# Patient Record
Sex: Female | Born: 1965 | Race: White | Hispanic: No | Marital: Single | Smoking: Never smoker
Health system: Southern US, Community
[De-identification: ages and names within clinical notes are randomized; demographics above are authoritative.]

## PROBLEM LIST (undated history)

## (undated) DIAGNOSIS — T4145XA Adverse effect of unspecified anesthetic, initial encounter: Secondary | ICD-10-CM

## (undated) DIAGNOSIS — R011 Cardiac murmur, unspecified: Secondary | ICD-10-CM

## (undated) DIAGNOSIS — I341 Nonrheumatic mitral (valve) prolapse: Secondary | ICD-10-CM

## (undated) DIAGNOSIS — Z87442 Personal history of urinary calculi: Secondary | ICD-10-CM

## (undated) DIAGNOSIS — R51 Headache: Secondary | ICD-10-CM

## (undated) DIAGNOSIS — T8859XA Other complications of anesthesia, initial encounter: Secondary | ICD-10-CM

## (undated) DIAGNOSIS — R519 Headache, unspecified: Secondary | ICD-10-CM

## (undated) HISTORY — PX: TUBAL LIGATION: SHX77

## (undated) HISTORY — PX: LITHOTRIPSY: SUR834

## (undated) SURGERY — BREAST BIOPSY WITH NEEDLE LOCALIZATION
Anesthesia: General | Laterality: Right

---

## 2004-09-18 ENCOUNTER — Emergency Department: Payer: Self-pay | Admitting: General Practice

## 2011-05-10 ENCOUNTER — Ambulatory Visit: Payer: Self-pay

## 2011-05-13 ENCOUNTER — Ambulatory Visit: Payer: Self-pay

## 2012-12-28 ENCOUNTER — Ambulatory Visit: Payer: Self-pay | Admitting: Family Medicine

## 2014-11-07 ENCOUNTER — Encounter: Payer: Self-pay | Admitting: Family Medicine

## 2014-11-07 ENCOUNTER — Ambulatory Visit (INDEPENDENT_AMBULATORY_CARE_PROVIDER_SITE_OTHER): Payer: Managed Care, Other (non HMO) | Admitting: Family Medicine

## 2014-11-07 VITALS — BP 104/66 | HR 88 | Ht 61.5 in | Wt 129.8 lb

## 2014-11-07 DIAGNOSIS — R0789 Other chest pain: Secondary | ICD-10-CM | POA: Diagnosis not present

## 2014-11-07 DIAGNOSIS — R5383 Other fatigue: Secondary | ICD-10-CM | POA: Diagnosis not present

## 2014-11-07 DIAGNOSIS — Z780 Asymptomatic menopausal state: Secondary | ICD-10-CM | POA: Diagnosis not present

## 2014-11-07 DIAGNOSIS — F5109 Other insomnia not due to a substance or known physiological condition: Secondary | ICD-10-CM | POA: Insufficient documentation

## 2014-11-07 DIAGNOSIS — K5900 Constipation, unspecified: Secondary | ICD-10-CM | POA: Insufficient documentation

## 2014-11-07 DIAGNOSIS — Z23 Encounter for immunization: Secondary | ICD-10-CM | POA: Diagnosis not present

## 2014-11-07 DIAGNOSIS — Z808 Family history of malignant neoplasm of other organs or systems: Secondary | ICD-10-CM | POA: Diagnosis not present

## 2014-11-07 NOTE — Progress Notes (Signed)
Date:  11/07/2014   Name:  Toni SanfilippoStephanie R Shea   DOB:  10-15-1965   MRN:  [id number]  PCP:  Schuyler AmorWilliam Kiri Hinderliter, MD    Chief Complaint: Establish Care; Fatigue; and Weight Gain   History of Present Illness:  This is a 49 y.o. female with persistent fatigue, terminal insomnia. Premenopausal sxs for 4 years, mother also went through menopause in early 2040's. Frequent hot flashes, black cohosh no help. Also c/o decreased concentration and irritability but denies depression. Has nonexertional chest pains with tightness L upper arm, no associated cardiac sxs, hx heart murmur. Chronic constipation, is gaining weight, can't exercise due to fatigue. Tetanus status unknown, declines flu imm. Mother with hx melanoma. Mammogram 2015, just received note to schedule one this year, no colonoscopy.  Review of Systems:  Review of Systems  Constitutional: Negative for fever.  HENT: Negative for ear pain, sore throat and trouble swallowing.   Eyes: Negative for pain.  Respiratory: Negative for cough and shortness of breath.   Cardiovascular: Negative for leg swelling.  Gastrointestinal: Negative for nausea, abdominal pain and diarrhea.  Endocrine: Negative for polyuria.  Genitourinary: Negative for difficulty urinating.  Neurological: Negative for syncope and light-headedness.  Hematological: Negative for adenopathy.  Psychiatric/Behavioral: Negative for suicidal ideas, hallucinations, behavioral problems, confusion, self-injury and agitation. The patient is not hyperactive.     Patient Active Problem List   Diagnosis Date Noted  . Menopause 11/07/2014  . Other fatigue 11/07/2014  . Non-cardiac chest pain 11/07/2014  . Terminal insomnia 11/07/2014  . Constipation 11/07/2014  . FH: melanoma 11/07/2014    Prior to Admission medications   Not on File    No Known Allergies  Past Surgical History  Procedure Laterality Date  . Cesarean section    . Tubal ligation    . Lithotripsy      Social  History  Substance Use Topics  . Smoking status: Never Smoker   . Smokeless tobacco: Not on file  . Alcohol Use: 0.0 oz/week    0 Standard drinks or equivalent per week    Family History  Problem Relation Age of Onset  . Cancer Mother     Melanoma    Medication list has been reviewed and updated.  Physical Examination: BP 104/66 mmHg  Pulse 88  Ht 5' 1.5" (1.562 m)  Wt 129 lb 12.8 oz (58.877 kg)  BMI 24.13 kg/m2  Physical Exam  Constitutional: She is oriented to person, place, and time. She appears well-developed and well-nourished.  HENT:  Head: Normocephalic and atraumatic.  Right Ear: External ear normal.  Left Ear: External ear normal.  Nose: Nose normal.  Mouth/Throat: Oropharynx is clear and moist.  Eyes: Conjunctivae and EOM are normal. Pupils are equal, round, and reactive to light.  Neck: Neck supple. No thyromegaly present.  Cardiovascular: Normal rate, regular rhythm and normal heart sounds.   Pulmonary/Chest: Effort normal and breath sounds normal.  Abdominal: Soft. She exhibits no distension and no mass. There is no tenderness.  Musculoskeletal: She exhibits no edema.  Lymphadenopathy:    She has no cervical adenopathy.  Neurological: She is alert and oriented to person, place, and time. Coordination normal.  Skin: Skin is warm and dry.  Psychiatric: She has a normal mood and affect. Her behavior is normal. Judgment and thought content normal.  Nursing note and vitals reviewed.   Assessment and Plan:  1. Menopause With frequent hot flashes, black cohosh not helpful - Lipid Profile - Vitamin D (25 hydroxy)  2.  Other fatigue Suspect menopausal depression though pt denies dysphoria, check labs, consider Paxil (may also help with hot flashes) - COMPLETE METABOLIC PANEL WITH GFR - CBC - TSH - B12  3. Non-cardiac chest pain Reassured, may be anxiety related, monitor  4. Terminal insomnia Suspect depression related  5. Constipation, unspecified  constipation type Check TSH  6. FH: melanoma  Return in about 4 weeks (around 12/05/2014).  Dionne Ano. Kingsley Spittle MD [facility]  11/07/2014

## 2014-11-08 LAB — CBC
HEMATOCRIT: 40.8 % (ref 34.0–46.6)
Hemoglobin: 13.6 g/dL (ref 11.1–15.9)
MCH: 31.1 pg (ref 26.6–33.0)
MCHC: 33.3 g/dL (ref 31.5–35.7)
MCV: 93 fL (ref 79–97)
PLATELETS: 242 10*3/uL (ref 150–379)
RBC: 4.38 x10E6/uL (ref 3.77–5.28)
RDW: 13.1 % (ref 12.3–15.4)
WBC: 4.5 10*3/uL (ref 3.4–10.8)

## 2014-11-08 LAB — LIPID PANEL
CHOL/HDL RATIO: 2.6 ratio (ref 0.0–4.4)
Cholesterol, Total: 144 mg/dL (ref 100–199)
HDL: 56 mg/dL (ref >39)
LDL CALC: 75 mg/dL (ref 0–99)
Triglycerides: 66 mg/dL (ref 0–149)
VLDL Cholesterol Cal: 13 mg/dL (ref 5–40)

## 2014-11-08 LAB — VITAMIN B12: Vitamin B-12: 397 pg/mL (ref 211–946)

## 2014-11-08 LAB — TSH: TSH: 0.951 u[IU]/mL (ref 0.450–4.500)

## 2014-11-08 LAB — VITAMIN D 25 HYDROXY (VIT D DEFICIENCY, FRACTURES): VIT D 25 HYDROXY: 43.2 ng/mL (ref 30.0–100.0)

## 2014-11-10 ENCOUNTER — Other Ambulatory Visit: Payer: Self-pay

## 2014-11-10 MED ORDER — PAROXETINE HCL 20 MG PO TABS: 20.0000 mg | ORAL_TABLET | Freq: Every day | ORAL | Status: DC

## 2014-11-10 NOTE — Addendum Note (Signed)
Addended by: Schuyler AmorPLONK, Tong Pieczynski on: 11/10/2014 08:47 AM   Modules accepted: Orders

## 2014-11-18 ENCOUNTER — Telehealth: Payer: Self-pay

## 2014-11-18 ENCOUNTER — Other Ambulatory Visit: Payer: Self-pay | Admitting: Family Medicine

## 2014-11-18 MED ORDER — CITALOPRAM HYDROBROMIDE 20 MG PO TABS: 20.0000 mg | ORAL_TABLET | Freq: Every day | ORAL | Status: DC

## 2014-11-18 NOTE — Telephone Encounter (Signed)
Recommend change to Celexa (Zoloft less effective in menopause), will send rx to pharmacy

## 2014-11-18 NOTE — Telephone Encounter (Signed)
Sent to [facility] 

## 2014-12-05 ENCOUNTER — Ambulatory Visit (INDEPENDENT_AMBULATORY_CARE_PROVIDER_SITE_OTHER): Payer: Managed Care, Other (non HMO) | Admitting: Family Medicine

## 2014-12-05 ENCOUNTER — Encounter: Payer: Self-pay | Admitting: Family Medicine

## 2014-12-05 VITALS — BP 120/64 | HR 80 | Ht 61.5 in | Wt 133.0 lb

## 2014-12-05 DIAGNOSIS — F3289 Other specified depressive episodes: Secondary | ICD-10-CM

## 2014-12-05 MED ORDER — CITALOPRAM HYDROBROMIDE 20 MG PO TABS: 20.0000 mg | ORAL_TABLET | Freq: Every day | ORAL | Status: DC

## 2014-12-05 NOTE — Progress Notes (Signed)
Date:  12/05/2014   Name:  Tery SanfilippoStephanie R Hallowell   DOB:  06/26/1965   MRN:  [id number]  PCP:  Schuyler AmorWilliam Blaize Epple, MD    Chief Complaint: Depression   History of Present Illness:  This is a 49 y.o. female started on Celexa one month ago for menopausal depression (did not tolerate Paxil due to palpitations). Reports insomnia resolved and fatigue and hot flashes improved. Constipation the same. No se's Celexa noted.  Review of Systems:  Review of Systems  Constitutional: Negative for unexpected weight change.  Respiratory: Negative for shortness of breath.   Cardiovascular: Negative for chest pain and palpitations.  Neurological: Negative for syncope and light-headedness.  Psychiatric/Behavioral: The patient is not nervous/anxious.     Patient Active Problem List   Diagnosis Date Noted  . 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 Menopause 11/07/2014  . Other fatigue 11/07/2014  . Non-cardiac chest pain 11/07/2014  . Constipation 11/07/2014  . FH: melanoma 11/07/2014    Prior to Admission medications   Medication Sig Start Date End Date Taking? Authorizing Provider  Biotin 10 MG TABS Take 1 tablet by mouth daily.   Yes Historical Provider, MD  citalopram (CELEXA) 20 MG tablet Take 1 tablet (20 mg total) by mouth daily. 12/05/14  Yes Schuyler AmorWilliam Hollyann Pablo, MD    No Known Allergies  Past Surgical History  Procedure Laterality Date  . Cesarean section    . Tubal ligation    . Lithotripsy      Social History  Substance Use Topics  . Smoking status: Never Smoker   . Smokeless tobacco: None  . Alcohol Use: 0.0 oz/week    0 Standard drinks or equivalent per week    Family History  Problem Relation Age of Onset  . Cancer Mother     Melanoma    Medication list has been reviewed and updated.  Physical Examination: BP 120/64 mmHg  Pulse 80  Ht 5' 1.5" (1.562 m)  Wt 133 lb (60.328 kg)  BMI 24.73 kg/m2  Physical Exam  Constitutional: She is oriented to person, place, and time. She  appears well-developed and well-nourished.  Neurological: She is alert and oriented to person, place, and time. Coordination normal.  Skin: Skin is warm and dry.  Psychiatric: She has a normal mood and affect. Her behavior is normal.  Nursing note and vitals reviewed.   Assessment and Plan:  1. Menopausal depression Improved on Celexa x 1 month, refill - citalopram (CELEXA) 20 MG tablet; Take 1 tablet (20 mg total) by mouth daily.  Dispense: 90 tablet; Refill: 3  Return in about 3 months (around 03/07/2015).  Dionne AnoWilliam M. Kingsley SpittlePlonk, Jr. MD [facility]  12/05/2014

## 2015-01-23 ENCOUNTER — Telehealth: Payer: Self-pay

## 2015-01-23 DIAGNOSIS — F3289 Other specified depressive episodes: Secondary | ICD-10-CM

## 2015-01-23 MED ORDER — CITALOPRAM HYDROBROMIDE 40 MG PO TABS: 40.0000 mg | ORAL_TABLET | Freq: Every day | ORAL | Status: DC

## 2015-01-23 NOTE — Addendum Note (Signed)
Addended by: Schuyler AmorPLONK, Brennin Durfee on: 01/23/2015 10:48 AM   Modules accepted: Orders

## 2015-01-23 NOTE — Telephone Encounter (Signed)
Sent to [facility] 

## 2015-03-06 ENCOUNTER — Ambulatory Visit: Payer: Self-pay | Admitting: Family Medicine

## 2015-03-12 ENCOUNTER — Ambulatory Visit (INDEPENDENT_AMBULATORY_CARE_PROVIDER_SITE_OTHER): Payer: Managed Care, Other (non HMO) | Admitting: Family Medicine

## 2015-03-12 ENCOUNTER — Encounter: Payer: Self-pay | Admitting: Family Medicine

## 2015-03-12 VITALS — BP 108/59 | HR 72 | Ht 61.5 in | Wt 134.6 lb

## 2015-03-12 DIAGNOSIS — F3289 Other specified depressive episodes: Secondary | ICD-10-CM | POA: Diagnosis not present

## 2015-03-12 NOTE — Progress Notes (Signed)
Date:  03/12/2015   Name:  Toni Shea   DOB:  02/02/1965   MRN:  [id number]  PCP:  Schuyler Amor, MD    Chief Complaint: Follow-up and Menopausal Depression   History of Present Illness:  This is a 50 y.o. female for f/u postmenopausal depression, Celexa dose increased last month to 40 mg daily, tolerating well, has helped significantly but getting tired mid-morning, taking Celexa one hour before bedtime.  Review of Systems:  Review of Systems  Respiratory: Negative for shortness of breath.   Cardiovascular: Negative for chest pain and leg swelling.  Neurological: Negative for syncope and light-headedness.    Patient Active Problem List   Diagnosis Date Noted  . 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 Menopause 11/07/2014  . Other fatigue 11/07/2014  . Non-cardiac chest pain 11/07/2014  . Constipation 11/07/2014  . FH: melanoma 11/07/2014    Prior to Admission medications   Medication Sig Start Date End Date Taking? Authorizing Provider  Biotin 10 MG TABS Take 1 tablet by mouth daily.   Yes Historical Provider, MD  citalopram (CELEXA) 40 MG tablet Take 1 tablet (40 mg total) by mouth daily. 01/23/15  Yes Schuyler Amor, MD    Allergies  Allergen Reactions  . Paxil [Paroxetine Hcl] Palpitations    Past Surgical History  Procedure Laterality Date  . Cesarean section    . Tubal ligation    . Lithotripsy      Social History  Substance Use Topics  . Smoking status: Never Smoker   . Smokeless tobacco: None  . Alcohol Use: 0.0 oz/week    0 Standard drinks or equivalent per week    Family History  Problem Relation Age of Onset  . Cancer Mother     Melanoma    Medication list has been reviewed and updated.  Physical Examination: BP 108/59 mmHg  Pulse 72  Ht 5' 1.5" (1.562 m)  Wt 134 lb 9.6 oz (61.054 kg)  BMI 25.02 kg/m2  Physical Exam  Constitutional: She appears well-developed and well-nourished.  Cardiovascular: Normal rate, regular rhythm and normal  heart sounds.   Pulmonary/Chest: Effort normal and breath sounds normal.  Musculoskeletal: She exhibits no edema.  Neurological: She is alert.  Skin: Skin is warm and dry.  Psychiatric: She has a normal mood and affect. Her behavior is normal.  Nursing note and vitals reviewed.   Assessment and Plan:  1. Menopausal depression Improved on increased dose Celexa, try taking in am to help with morning fatigue, consider Effexor if sxs persist  Return in about 6 months (around 09/09/2015).  Dionne Ano. Kingsley Spittle MD [facility]  03/12/2015

## 2015-03-26 ENCOUNTER — Telehealth: Payer: Self-pay

## 2015-03-26 MED ORDER — VENLAFAXINE HCL ER 75 MG PO CP24: 75.0000 mg | ORAL_CAPSULE | Freq: Every day | ORAL | Status: DC

## 2015-03-26 NOTE — Addendum Note (Signed)
Addended by: Schuyler AmorPLONK, Sigurd Pugh on: 03/26/2015 10:03 AM   Modules accepted: Orders, Medications

## 2015-03-26 NOTE — Telephone Encounter (Signed)
Rx for Effexor XR sent, inform pt to stop Celexa.

## 2015-03-26 NOTE — Telephone Encounter (Signed)
Sent to [facility] 

## 2015-06-24 ENCOUNTER — Other Ambulatory Visit: Payer: Self-pay | Admitting: Family Medicine

## 2015-06-25 ENCOUNTER — Telehealth: Payer: Self-pay

## 2015-06-25 ENCOUNTER — Other Ambulatory Visit: Payer: Self-pay | Admitting: Family Medicine

## 2015-06-25 MED ORDER — VENLAFAXINE HCL ER 75 MG PO CP24: 75.0000 mg | ORAL_CAPSULE | Freq: Every day | ORAL | Status: DC

## 2015-06-25 NOTE — Telephone Encounter (Signed)
Effexor refill sent.  

## 2015-06-25 NOTE — Telephone Encounter (Signed)
Needs menopause med refill

## 2015-09-04 ENCOUNTER — Ambulatory Visit (INDEPENDENT_AMBULATORY_CARE_PROVIDER_SITE_OTHER): Payer: Managed Care, Other (non HMO) | Admitting: Internal Medicine

## 2015-09-04 ENCOUNTER — Encounter: Payer: Self-pay | Admitting: Internal Medicine

## 2015-09-04 VITALS — BP 110/78 | HR 94 | Resp 16 | Ht 62.0 in | Wt 134.0 lb

## 2015-09-04 DIAGNOSIS — Z1239 Encounter for other screening for malignant neoplasm of breast: Secondary | ICD-10-CM

## 2015-09-04 DIAGNOSIS — F3289 Other specified depressive episodes: Secondary | ICD-10-CM | POA: Diagnosis not present

## 2015-09-04 NOTE — Progress Notes (Signed)
    Date:  09/04/2015   Name:  Toni SanfilippoStephanie R Nappier   DOB:  04-Jun-1965   MRN:  [id number]   Chief Complaint: Menopause (Still some hot flashes and some weight gain.) Menopausal sx - much better with effexor.  Night sweats have resolved.  Has minimal hot flashes during the day.  Her last full menstrual cycle was about 1 year ago. He months ago she had a couple of days of very light spotting. She has a gynecologist [facility] but he recently retired. She is due for a mammogram. Her last Pap smear was 1 year ago. She had extensive labs done in October 2016 which were all normal.     Wt Readings from Last 3 Encounters:  09/04/15 134 lb (60.8 kg)  03/12/15 134 lb 9.6 oz (61.1 kg)  12/05/14 133 lb (60.3 kg)    Review of Systems  Constitutional: Negative for chills, fatigue, fever and unexpected weight change.  Respiratory: Negative for choking and shortness of breath.   Cardiovascular: Negative for chest pain, palpitations and leg swelling.  Gastrointestinal: Positive for constipation. Negative for abdominal pain and diarrhea.  Genitourinary: Negative for dysuria, menstrual problem, pelvic pain and vaginal discharge.  Musculoskeletal: Negative for arthralgias.  Neurological: Negative for dizziness and headaches.  Hematological: Negative for adenopathy. Does not bruise/bleed easily.    Patient Active Problem List   Diagnosis Date Noted  . 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 Menopause 11/07/2014  . Constipation 11/07/2014  . FH: melanoma 11/07/2014    Prior to Admission medications   Medication Sig Start Date End Date Taking? Authorizing Provider  Biotin 10 MG TABS Take 1 tablet by mouth daily.   Yes Historical Provider, MD  venlafaxine XR (EFFEXOR XR) 75 MG 24 hr capsule Take 1 capsule (75 mg total) by mouth daily with breakfast. 06/25/15  Yes Schuyler AmorWilliam [facility], MD    Allergies  Allergen Reactions  . Paxil [Paroxetine Hcl] Palpitations    Past Surgical History:  Procedure Laterality  Date  . CESAREAN SECTION    . LITHOTRIPSY    . TUBAL LIGATION      Social History  Substance Use Topics  . Smoking status: Never Smoker  . Smokeless tobacco: Not on file  . Alcohol use 0.0 oz/week     Medication list has been reviewed and updated.   Physical Exam  Constitutional: She is oriented to person, place, and time. She appears well-developed. No distress.  HENT:  Head: Normocephalic and atraumatic.  Neck: Normal range of motion. Carotid bruit is not present.  Cardiovascular: Normal rate, regular rhythm and normal heart sounds.   Pulmonary/Chest: Effort normal and breath sounds normal. No respiratory distress.  Musculoskeletal: She exhibits no edema.  Neurological: She is alert and oriented to person, place, and time.  Skin: Skin is warm and dry. No rash noted.  Psychiatric: She has a normal mood and affect. Her speech is normal and behavior is normal. Thought content normal.  Nursing note and vitals reviewed.   BP 110/78 (BP Location: Right Arm, Patient Position: Sitting, Cuff Size: Normal)   Pulse 94   Resp 16   Ht 5\' 2"  (1.575 m)   Wt 134 lb (60.8 kg)   SpO2 100%   BMI 24.51 kg/m   Assessment and Plan: 1. Menopausal depression Doing well on Effexor  2. Breast cancer screening - MM DIGITAL SCREENING BILATERAL; Future   Bari EdwardLaura Lexany Belknap, MD [facility]  09/04/2015

## 2015-09-04 NOTE — Patient Instructions (Signed)
Breast Self-Awareness Practicing breast self-awareness may pick up problems early, prevent significant medical complications, and possibly save your life. By practicing breast self-awareness, you can become familiar with how your breasts look and feel and if your breasts are changing. This allows you to notice changes early. It can also offer you some reassurance that your breast health is good. One way to learn what is normal for your breasts and whether your breasts are changing is to do a breast self-exam. If you find a lump or something that was not present in the past, it is best to contact your caregiver right away. Other findings that should be evaluated by your caregiver include nipple discharge, especially if it is bloody; skin changes or reddening; areas where the skin seems to be pulled in (retracted); or new lumps and bumps. Breast pain is seldom associated with cancer (malignancy), but should also be evaluated by a caregiver. HOW TO PERFORM A BREAST SELF-EXAM The best time to examine your breasts is 5-7 days after your menstrual period is over. During menstruation, the breasts are lumpier, and it may be more difficult to pick up changes. If you do not menstruate, have reached menopause, or had your uterus removed (hysterectomy), you should examine your breasts at regular intervals, such as monthly. If you are breastfeeding, examine your breasts after a feeding or after using a breast pump. Breast implants do not decrease the risk for lumps or tumors, so continue to perform breast self-exams as recommended. Talk to your caregiver about how to determine the difference between the implant and breast tissue. Also, talk about the amount of pressure you should use during the exam. Over time, you will become more familiar with the variations of your breasts and more comfortable with the exam. A breast self-exam requires you to remove all your clothes above the waist. 1. Look at your breasts and nipples.  Stand in front of a mirror in a room with good lighting. With your hands on your hips, push your hands firmly downward. Look for a difference in shape, contour, and size from one breast to the other (asymmetry). Asymmetry includes puckers, dips, or bumps. Also, look for skin changes, such as reddened or scaly areas on the breasts. Look for nipple changes, such as discharge, dimpling, repositioning, or redness. 2. Carefully feel your breasts. This is best done either in the shower or tub while using soapy water or when flat on your back. Place the arm (on the side of the breast you are examining) above your head. Use the pads (not the fingertips) of your three middle fingers on your opposite hand to feel your breasts. Start in the underarm area and use  inch (2 cm) overlapping circles to feel your breast. Use 3 different levels of pressure (light, medium, and firm pressure) at each circle before moving to the next circle. The light pressure is needed to feel the tissue closest to the skin. The medium pressure will help to feel breast tissue a little deeper, while the firm pressure is needed to feel the tissue close to the ribs. Continue the overlapping circles, moving downward over the breast until you feel your ribs below your breast. Then, move one finger-width towards the center of the body. Continue to use the  inch (2 cm) overlapping circles to feel your breast as you move slowly up toward the collar bone (clavicle) near the base of the neck. Continue the up and down exam using all 3 pressures until you reach the   middle of the chest. Do this with each breast, carefully feeling for lumps or changes. 3.  Keep a written record with breast changes or normal findings for each breast. By writing this information down, you do not need to depend only on memory for size, tenderness, or location. Write down where you are in your menstrual cycle, if you are still menstruating. Breast tissue can have some lumps or  thick tissue. However, see your caregiver if you find anything that concerns you.  SEEK MEDICAL CARE IF:  You see a change in shape, contour, or size of your breasts or nipples.   You see skin changes, such as reddened or scaly areas on the breasts or nipples.   You have an unusual discharge from your nipples.   You feel a new lump or unusually thick areas.    This information is not intended to replace advice given to you by your health care provider. Make sure you discuss any questions you have with your health care provider.   Document Released: 01/03/2005 Document Revised: 12/21/2011 Document Reviewed: 04/20/2011 Elsevier Interactive Patient Education 2016 Elsevier Inc.  

## 2015-09-11 ENCOUNTER — Ambulatory Visit: Payer: Self-pay | Admitting: Family Medicine

## 2015-10-02 ENCOUNTER — Ambulatory Visit
Admission: RE | Admit: 2015-10-02 | Discharge: 2015-10-02 | Disposition: A | Discharge status: Home / Self Care | Payer: Managed Care, Other (non HMO) | Source: Ambulatory Visit | Attending: Internal Medicine | Admitting: Internal Medicine

## 2015-10-02 DIAGNOSIS — Z1231 Encounter for screening mammogram for malignant neoplasm of breast: Secondary | ICD-10-CM | POA: Diagnosis not present

## 2015-10-02 DIAGNOSIS — Z1239 Encounter for other screening for malignant neoplasm of breast: Secondary | ICD-10-CM | POA: Diagnosis not present

## 2015-10-05 ENCOUNTER — Other Ambulatory Visit: Payer: Self-pay | Admitting: Internal Medicine

## 2015-10-05 DIAGNOSIS — R921 Mammographic calcification found on diagnostic imaging of breast: Secondary | ICD-10-CM

## 2015-10-23 ENCOUNTER — Ambulatory Visit
Admission: RE | Admit: 2015-10-23 | Discharge: 2015-10-23 | Disposition: A | Discharge status: Home / Self Care | Payer: Managed Care, Other (non HMO) | Source: Ambulatory Visit | Attending: Internal Medicine | Admitting: Internal Medicine

## 2015-10-23 DIAGNOSIS — R921 Mammographic calcification found on diagnostic imaging of breast: Secondary | ICD-10-CM | POA: Insufficient documentation

## 2015-10-26 ENCOUNTER — Other Ambulatory Visit: Payer: Self-pay | Admitting: Internal Medicine

## 2015-10-26 DIAGNOSIS — R921 Mammographic calcification found on diagnostic imaging of breast: Secondary | ICD-10-CM

## 2015-12-03 ENCOUNTER — Ambulatory Visit
Admission: RE | Admit: 2015-12-03 | Discharge: 2015-12-03 | Disposition: A | Discharge status: Home / Self Care | Payer: Managed Care, Other (non HMO) | Source: Ambulatory Visit | Attending: Internal Medicine | Admitting: Internal Medicine

## 2015-12-03 DIAGNOSIS — N6091 Unspecified benign mammary dysplasia of right breast: Secondary | ICD-10-CM | POA: Insufficient documentation

## 2015-12-03 DIAGNOSIS — R921 Mammographic calcification found on diagnostic imaging of breast: Secondary | ICD-10-CM

## 2015-12-03 HISTORY — PX: BREAST BIOPSY: SHX20

## 2015-12-04 LAB — SURGICAL PATHOLOGY

## 2015-12-16 ENCOUNTER — Other Ambulatory Visit: Payer: Self-pay | Admitting: Internal Medicine

## 2015-12-16 DIAGNOSIS — N6099 Unspecified benign mammary dysplasia of unspecified breast: Secondary | ICD-10-CM | POA: Insufficient documentation

## 2015-12-16 DIAGNOSIS — R928 Other abnormal and inconclusive findings on diagnostic imaging of breast: Secondary | ICD-10-CM

## 2015-12-18 ENCOUNTER — Encounter: Payer: Self-pay | Admitting: Surgery

## 2015-12-18 ENCOUNTER — Ambulatory Visit (INDEPENDENT_AMBULATORY_CARE_PROVIDER_SITE_OTHER): Payer: Managed Care, Other (non HMO) | Admitting: Surgery

## 2015-12-18 VITALS — BP 137/71 | HR 112 | Temp 98.5°F | Ht 62.0 in | Wt 142.0 lb

## 2015-12-18 DIAGNOSIS — R928 Other abnormal and inconclusive findings on diagnostic imaging of breast: Secondary | ICD-10-CM | POA: Diagnosis not present

## 2015-12-18 NOTE — Patient Instructions (Signed)
We have spoken today about removing a lump in your breast. We will have this done on (to be determined) [facility]. We will call you with the details of this surgery.  You will most likely be able to leave the hospital several hours after your surgery.  Plan to tenatively be off work for 1-2 weeks following the surgery and may return with approximately 4 more weeks of a lifting restriction, no greater than 15 lbs.    Lumpectomy A lumpectomy is a form of "breast conserving" or "breast preservation" surgery. It may also be referred to as a partial mastectomy. During a lumpectomy, the portion of the breast that contains the cancerous tumor or breast mass (the lump) is removed. Some normal tissue around the lump may also be removed to make sure all of the tumor has been removed.  LET [facility]OUR [facility]ARE PROVIDER KNOW ABOUT:  Any allergies you have.  All medicines you are taking, including vitamins, herbs, eye drops, creams, and over-the-counter medicines.  Previous problems you or members of your family have had with the use of anesthetics.  Any blood disorders you have.  Previous surgeries you have had.  Medical conditions you have. RISKS AND COMPLICATIONS Generally, this is a safe procedure. However, problems can occur and include:  Bleeding.  Infection.  Pain.  Temporary swelling.  Change in the shape of the breast, particularly if a large portion is removed. BEFORE THE PROCEDURE  Ask your health care provider about changing or stopping your regular medicines. This is especially important if you are taking diabetes medicines or blood thinners.  Do not eat or drink anything after midnight on the night before the procedure or as directed by your health care provider. Ask your health care provider if you can take a sip of water with any approved medicines.  On the day of surgery, your health care provider will use a mammogram or ultrasound to locate and mark the tumor in  your breast. These markings on your breast will show where the cut (incision) will be made. PROCEDURE   An IV tube will be put into one of your veins.  You may be given medicine to help you relax before the surgery (sedative). You will be given one of the following:  A medicine that numbs the area (local anesthetic).  A medicine that makes you fall asleep (general anesthetic).  Your health care provider will use a kind of electric scalpel that uses heat to minimize bleeding (electrocautery knife).  A curved incision (like a smile or frown) that follows the natural curve of your breast is made, to allow for minimal scarring and better healing.  The tumor will be removed with some of the surrounding tissue. This will be sent to the lab for analysis. Your health care provider may also remove your lymph nodes at this time if needed.  Sometimes, but not always, a rubber tube called a drain will be surgically inserted into your breast area or armpit to collect excess fluid that may accumulate in the space where the tumor was. This drain is connected to a plastic bulb on the outside of your body. This drain creates suction to help remove the fluid.  The incisions will be closed with stitches (sutures).  A bandage may be placed over the incisions. AFTER THE PROCEDURE  You will be taken to the recovery area.  You will be given medicine for pain.  A small rubber drain may be placed in the  breast for 2-3 days to prevent a collection of blood (hematoma) from developing in the breast. You will be given instructions on caring for the drain before you go home.  A pressure bandage (dressing) will be applied for 1-2 days to prevent bleeding. Ask your health care provider how to care for your bandage at home.   This information is not intended to replace advice given to you by your health care provider. Make sure you discuss any questions you have with your health care provider.   Document Released:  02/14/2006 Document Revised: 01/24/2014 Document Reviewed: 06/08/2012 Elsevier Interactive Patient Education [employer].

## 2015-12-18 NOTE — Progress Notes (Signed)
Surgical Consultation  12/18/2015  Toni SanfilippoStephanie R Shea is an 50 y.o. female.   CC: Right breast ADH on core  HPI: This patient is at a right breast core biopsy showing ADH with minimal atypia. This was found on routine screening mammogram that the necessitated further workup and ultimately core biopsy on the right. She does regular self exams has never noticed a mass before she did have a cyst in her left breast identified at one point several years ago did not require surgery. She's had no other breast surgery denies nipple discharge.  The only medication she takes is effects or has not been on hormone therapy.   GYN History: G2 P2 AB 0  Family Breast Cancer History: Paternal grandmother with breast cancer  No past medical history on file.  Past Surgical History:  Procedure Laterality Date  . CESAREAN SECTION    . LITHOTRIPSY    . TUBAL LIGATION      Family History  Problem Relation Age of Onset  . Cancer Mother     Melanoma  . Breast cancer Paternal Grandmother     Social History:  reports that she has never smoked. She does not have any smokeless tobacco history on file. She reports that she drinks alcohol. She reports that she does not use drugs.  Allergies:  Allergies  Allergen Reactions  . Paxil [Paroxetine Hcl] Palpitations    Medications reviewed.   Review of Systems:   Review of Systems  Constitutional: Negative for chills and fever.  HENT: Negative.   Eyes: Negative.   Respiratory: Negative.   Cardiovascular: Negative.   Gastrointestinal: Negative.   Genitourinary: Negative.   Musculoskeletal: Negative.   Skin: Negative.   Neurological: Negative.   Endo/Heme/Allergies: Negative.   Psychiatric/Behavioral: Negative.      Physical Exam:  There were no vitals taken for this visit.  Physical Exam  Constitutional: She is oriented to person, place, and time and well-developed, well-nourished, and in no distress. No distress.  HENT:  Head:  Normocephalic and atraumatic.  Eyes: Right eye exhibits no discharge. Left eye exhibits no discharge. No scleral icterus.  Neck: Normal range of motion.  Cardiovascular: Normal rate, regular rhythm and normal heart sounds.   Pulmonary/Chest: Effort normal and breath sounds normal. No stridor. No respiratory distress. She has no wheezes.    Abdominal: Soft. She exhibits no distension.  Musculoskeletal: Normal range of motion. She exhibits no edema.  Lymphadenopathy:    She has no cervical adenopathy.  Neurological: She is alert and oriented to person, place, and time.  Skin: Skin is warm and dry. She is not diaphoretic.  Psychiatric: Mood and affect normal.  Vitals reviewed.   Breast Exam: On the right side is a biopsy site from core biopsy at the 12:00 position with moderate resolving ecchymosis no erythema and no underlying mass no axillary adenopathy  On the left there is no mass and no axillary adenopathy    No results found for this or any previous visit (from the past 48 hour(s)). No results found.  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 She requires excisional biopsy utilizing a needle localization technique. A clip was in place. Mammograms have been reviewed. Offering surgery has been discussed the options of observation of been reviewed the risk of bleeding infection positive margins the potential for cancer being present or DCIS being present was discussed. We  discussed the risks of hematoma seroma cosmetic deformity and the need for further surgery should cancer be identified she understood and agreed to proceed.  Lattie Hawichard E Ronda Rajkumar, MD, FACS

## 2015-12-25 ENCOUNTER — Other Ambulatory Visit: Payer: Self-pay

## 2015-12-25 DIAGNOSIS — D0511 Intraductal carcinoma in situ of right breast: Secondary | ICD-10-CM

## 2015-12-28 ENCOUNTER — Other Ambulatory Visit: Payer: Self-pay | Admitting: Surgery

## 2015-12-28 DIAGNOSIS — D0511 Intraductal carcinoma in situ of right breast: Secondary | ICD-10-CM

## 2015-12-29 ENCOUNTER — Other Ambulatory Visit: Payer: Self-pay | Admitting: General Surgery

## 2015-12-31 ENCOUNTER — Telehealth: Payer: Self-pay | Admitting: Surgery

## 2015-12-31 NOTE — Telephone Encounter (Signed)
Pt advised of pre op date/time and sx date. Sx: 01/12/16 with Dr Magda Kielooper--Right lumpectomy with NL.  Pre op: 01/05/16 between 9-1--Phone.   Patient has been advised to [facility] at 7:50am the day of surgery.

## 2016-01-05 ENCOUNTER — Encounter
Admission: RE | Admit: 2016-01-05 | Discharge: 2016-01-05 | Disposition: A | Discharge status: Home / Self Care | Payer: Managed Care, Other (non HMO) | Source: Ambulatory Visit | Attending: Surgery | Admitting: Surgery

## 2016-01-05 HISTORY — DX: Nonrheumatic mitral (valve) prolapse: I34.1

## 2016-01-05 HISTORY — DX: Personal history of urinary calculi: Z87.442

## 2016-01-05 HISTORY — DX: Headache, unspecified: R51.9

## 2016-01-05 HISTORY — DX: Adverse effect of unspecified anesthetic, initial encounter: T41.45XA

## 2016-01-05 HISTORY — DX: Cardiac murmur, unspecified: R01.1

## 2016-01-05 HISTORY — DX: Headache: R51

## 2016-01-05 HISTORY — DX: Other complications of anesthesia, initial encounter: T88.59XA

## 2016-01-05 NOTE — Patient Instructions (Signed)
  Your procedure is scheduled on: 01-12-16 (TUESDAY) Report to [facility] @ 7:30 AM PER PT   Remember: Instructions that are not followed completely may result in serious medical risk, up to and including death, or upon the discretion of your surgeon and anesthesiologist your surgery may need to be rescheduled.    _x___ 1. Do not eat food or drink liquids after midnight. No gum chewing or hard candies.     __x__ 2. No Alcohol for 24 hours before or after surgery.   __x__3. No Smoking for 24 prior to surgery.   ____  4. Bring all medications with you on the day of surgery if instructed.    __x__ 5. Notify your doctor if there is any change in your medical condition     (cold, fever, infections).     Do not wear jewelry, make-up, hairpins, clips or nail polish.  Do not wear lotions, powders, or perfumes. You may wear deodorant.  Do not shave 48 hours prior to surgery. Men may shave face and neck.  Do not bring valuables to the hospital.    [facility] is not responsible for any belongings or valuables.               Contacts, dentures or bridgework may not be worn into surgery.  Leave your suitcase in the car. After surgery it may be brought to your room.  For patients admitted to the hospital, discharge time is determined by your treatment team.   Patients discharged the day of surgery will not be allowed to drive home.  You will need someone to drive you home and stay with you the night of your procedure.    Please read over the following fact sheets that you were given:   [facility] Preparing for Surgery and or MRSA Information   ____ Take these medicines the morning of surgery with A SIP OF WATER:    1. NONE  2.  3.  4.  5.  6.  ____Fleets enema or Magnesium Citrate as directed.   ____ Use CHG Soap or sage wipes as directed on instruction sheet   ____ Use inhalers on the day of surgery and bring to hospital day of surgery  ____ Stop metformin 2 days prior  to surgery    ____ Take 1/2 of usual insulin dose the night before surgery and none on the morning of  surgery.   ____ Stop Aspirin, Coumadin, Pllavix ,Eliquis, Effient, or Pradaxa  x__ Stop Anti-inflammatories such as Advil, Aleve, Ibuprofen, Motrin, Naproxen,          Naprosyn, Goodies powders or aspirin products NOW-Ok to take Tylenol.   _X___ Stop supplements until after surgery-STOP FISH OIL NOW  ____ Bring C-Pap to the hospital.

## 2016-01-05 NOTE — Pre-Procedure Instructions (Signed)
DURING PHONE INTERVIEW I TOLD PT SHE NEEDED TO COME IN FOR EKG DUE TO MVP.  PT STATES SHE WORKS IN [place] AND WOULD BE UNABLE TO COME IN PRIOR TO SURGERY. I EXPLAINED THAT IF EKG IS DONE AM OF SURGERY AND IT IS ABNORMAL, THEN ANESTHESIA COULD CANCEL HER SURGERY. PT STATES UNDERSTANDS THIS.

## 2016-01-08 ENCOUNTER — Other Ambulatory Visit: Payer: Self-pay

## 2016-01-08 DIAGNOSIS — N6091 Unspecified benign mammary dysplasia of right breast: Secondary | ICD-10-CM

## 2016-01-11 MED ORDER — CEFAZOLIN SODIUM-DEXTROSE 2-4 GM/100ML-% IV SOLN: 2.0000 g | INTRAVENOUS | Status: DC

## 2016-01-12 ENCOUNTER — Encounter: Payer: Self-pay | Admitting: *Deleted

## 2016-01-12 ENCOUNTER — Ambulatory Visit: Payer: Managed Care, Other (non HMO) | Admitting: Certified Registered Nurse Anesthetist

## 2016-01-12 ENCOUNTER — Encounter
Admission: RE | Disposition: A | Discharge status: Home / Self Care | Payer: Self-pay | Source: Ambulatory Visit | Attending: Surgery

## 2016-01-12 ENCOUNTER — Ambulatory Visit
Admission: RE | Admit: 2016-01-12 | Discharge: 2016-01-12 | Disposition: A | Discharge status: Home / Self Care | Payer: Managed Care, Other (non HMO) | Source: Ambulatory Visit | Attending: Surgery | Admitting: Surgery

## 2016-01-12 DIAGNOSIS — D0511 Intraductal carcinoma in situ of right breast: Secondary | ICD-10-CM

## 2016-01-12 DIAGNOSIS — R928 Other abnormal and inconclusive findings on diagnostic imaging of breast: Secondary | ICD-10-CM | POA: Diagnosis not present

## 2016-01-12 DIAGNOSIS — N6091 Unspecified benign mammary dysplasia of right breast: Secondary | ICD-10-CM | POA: Insufficient documentation

## 2016-01-12 DIAGNOSIS — Z79899 Other long term (current) drug therapy: Secondary | ICD-10-CM | POA: Insufficient documentation

## 2016-01-12 HISTORY — PX: BREAST EXCISIONAL BIOPSY: SUR124

## 2016-01-12 HISTORY — PX: BREAST LUMPECTOMY WITH NEEDLE LOCALIZATION: SHX5759

## 2016-01-12 LAB — POCT PREGNANCY, URINE: PREG TEST UR: NEGATIVE

## 2016-01-12 SURGERY — BREAST LUMPECTOMY WITH NEEDLE LOCALIZATION
Anesthesia: General | Laterality: Right

## 2016-01-12 MED ORDER — PROPOFOL 10 MG/ML IV BOLUS
INTRAVENOUS | Status: AC
  Filled 2016-01-12: qty 20

## 2016-01-12 MED ORDER — ONDANSETRON HCL 4 MG/2ML IJ SOLN: 4.0000 mg | Freq: Once | INTRAMUSCULAR | Status: DC | PRN

## 2016-01-12 MED ORDER — DEXAMETHASONE SODIUM PHOSPHATE 10 MG/ML IJ SOLN
INTRAMUSCULAR | Status: DC | PRN
Start: 2016-01-12 — End: 2016-01-12
  Administered 2016-01-12: 10 mg via INTRAVENOUS

## 2016-01-12 MED ORDER — LIDOCAINE HCL (CARDIAC) 20 MG/ML IV SOLN
INTRAVENOUS | Status: DC | PRN
  Administered 2016-01-12: 88 mg via INTRAVENOUS

## 2016-01-12 MED ORDER — BUPIVACAINE-EPINEPHRINE (PF) 0.25% -1:200000 IJ SOLN
INTRAMUSCULAR | Status: AC
  Filled 2016-01-12: qty 30

## 2016-01-12 MED ORDER — MIDAZOLAM HCL 2 MG/2ML IJ SOLN
INTRAMUSCULAR | Status: DC | PRN
  Administered 2016-01-12: 2 mg via INTRAVENOUS

## 2016-01-12 MED ORDER — ONDANSETRON HCL 4 MG/2ML IJ SOLN
INTRAMUSCULAR | Status: DC | PRN
  Administered 2016-01-12: 4 mg via INTRAVENOUS

## 2016-01-12 MED ORDER — LACTATED RINGERS IV SOLN
INTRAVENOUS | Status: DC
  Administered 2016-01-12: 09:00:00 via INTRAVENOUS

## 2016-01-12 MED ORDER — BUPIVACAINE-EPINEPHRINE (PF) 0.25% -1:200000 IJ SOLN
INTRAMUSCULAR | Status: DC | PRN
  Administered 2016-01-12: 30 mL

## 2016-01-12 MED ORDER — FAMOTIDINE 20 MG PO TABS
20.0000 mg | ORAL_TABLET | Freq: Once | ORAL | Status: AC
  Administered 2016-01-12: 20 mg via ORAL

## 2016-01-12 MED ORDER — KETOROLAC TROMETHAMINE 30 MG/ML IJ SOLN
INTRAMUSCULAR | Status: AC
  Filled 2016-01-12: qty 1

## 2016-01-12 MED ORDER — ONDANSETRON HCL 4 MG/2ML IJ SOLN
INTRAMUSCULAR | Status: AC
  Filled 2016-01-12: qty 2

## 2016-01-12 MED ORDER — FENTANYL CITRATE (PF) 100 MCG/2ML IJ SOLN
INTRAMUSCULAR | Status: DC | PRN
  Administered 2016-01-12: 50 ug via INTRAVENOUS

## 2016-01-12 MED ORDER — KETOROLAC TROMETHAMINE 30 MG/ML IJ SOLN
INTRAMUSCULAR | Status: DC | PRN
  Administered 2016-01-12: 30 mg via INTRAVENOUS

## 2016-01-12 MED ORDER — DEXAMETHASONE SODIUM PHOSPHATE 10 MG/ML IJ SOLN
INTRAMUSCULAR | Status: AC
  Filled 2016-01-12: qty 1

## 2016-01-12 MED ORDER — MIDAZOLAM HCL 2 MG/2ML IJ SOLN
INTRAMUSCULAR | Status: AC
  Filled 2016-01-12: qty 2

## 2016-01-12 MED ORDER — LIDOCAINE 2% (20 MG/ML) 5 ML SYRINGE
INTRAMUSCULAR | Status: AC
  Filled 2016-01-12: qty 5

## 2016-01-12 MED ORDER — LACTATED RINGERS IV SOLN
INTRAVENOUS | Status: DC | PRN
  Administered 2016-01-12: 10:00:00 via INTRAVENOUS

## 2016-01-12 MED ORDER — FENTANYL CITRATE (PF) 100 MCG/2ML IJ SOLN
25.0000 ug | INTRAMUSCULAR | Status: DC | PRN
  Administered 2016-01-12 (×4): 25 ug via INTRAVENOUS

## 2016-01-12 MED ORDER — PROPOFOL 10 MG/ML IV BOLUS
INTRAVENOUS | Status: DC | PRN
  Administered 2016-01-12: 160 mg via INTRAVENOUS

## 2016-01-12 MED ORDER — CHLORHEXIDINE GLUCONATE CLOTH 2 % EX PADS: 6.0000 | MEDICATED_PAD | Freq: Once | CUTANEOUS | Status: DC

## 2016-01-12 MED ORDER — ACETAMINOPHEN 10 MG/ML IV SOLN
INTRAVENOUS | Status: DC | PRN
  Administered 2016-01-12: 1000 mg via INTRAVENOUS

## 2016-01-12 MED ORDER — FENTANYL CITRATE (PF) 100 MCG/2ML IJ SOLN
INTRAMUSCULAR | Status: AC
  Filled 2016-01-12: qty 2

## 2016-01-12 MED ORDER — HYDROCODONE-ACETAMINOPHEN 5-300 MG PO TABS: 1.0000 | ORAL_TABLET | ORAL | 0 refills | Status: DC | PRN

## 2016-01-12 MED ORDER — CHLORHEXIDINE GLUCONATE CLOTH 2 % EX PADS
6.0000 | MEDICATED_PAD | Freq: Once | CUTANEOUS | Status: AC
  Administered 2016-01-12: 6 via TOPICAL

## 2016-01-12 MED ORDER — ACETAMINOPHEN 10 MG/ML IV SOLN
INTRAVENOUS | Status: AC
  Filled 2016-01-12: qty 100

## 2016-01-12 SURGICAL SUPPLY — 28 items
ADHESIVE MASTISOL STRL (MISCELLANEOUS) ×3 IMPLANT
BLADE SURG 15 STRL LF DISP TIS (BLADE) ×1 IMPLANT
BLADE SURG 15 STRL SS (BLADE) ×2
CANISTER SUCT 1200ML W/VALVE (MISCELLANEOUS) ×3 IMPLANT
CHLORAPREP W/TINT 26ML (MISCELLANEOUS) ×3 IMPLANT
CLOSURE WOUND 1/2 X4 (GAUZE/BANDAGES/DRESSINGS) ×1
CNTNR SPEC 2.5X3XGRAD LEK (MISCELLANEOUS) ×1
CONT SPEC 4OZ STER OR WHT (MISCELLANEOUS) ×2
CONTAINER SPEC 2.5X3XGRAD LEK (MISCELLANEOUS) ×1 IMPLANT
DEVICE DUBIN SPECIMEN MAMMOGRA (MISCELLANEOUS) ×3 IMPLANT
DRAPE LAPAROTOMY TRNSV 106X77 (MISCELLANEOUS) ×3 IMPLANT
ELECT REM PT RETURN 9FT ADLT (ELECTROSURGICAL) ×3
ELECTRODE REM PT RTRN 9FT ADLT (ELECTROSURGICAL) ×1 IMPLANT
GAUZE FLUFF 18X24 1PLY STRL (GAUZE/BANDAGES/DRESSINGS) ×3 IMPLANT
GLOVE BIO SURGEON STRL SZ8 (GLOVE) ×3 IMPLANT
GOWN STRL REUS W/ TWL LRG LVL3 (GOWN DISPOSABLE) ×2 IMPLANT
GOWN STRL REUS W/TWL LRG LVL3 (GOWN DISPOSABLE) ×4
KIT RM TURNOVER STRD PROC AR (KITS) ×3 IMPLANT
LABEL OR SOLS (LABEL) ×3 IMPLANT
NDL SAFETY 22GX1.5 (NEEDLE) ×3 IMPLANT
PACK BASIN MINOR ARMC (MISCELLANEOUS) ×3 IMPLANT
STRIP CLOSURE SKIN 1/2X4 (GAUZE/BANDAGES/DRESSINGS) ×2 IMPLANT
SUT MNCRL AB 4-0 PS2 18 (SUTURE) ×3 IMPLANT
SUT VIC AB 3-0 SH 27 (SUTURE) ×2
SUT VIC AB 3-0 SH 27X BRD (SUTURE) ×1 IMPLANT
SYRINGE 10CC LL (SYRINGE) ×3 IMPLANT
TAPE MICROFOAM 4IN (TAPE) ×3 IMPLANT
WATER STERILE IRR 1000ML POUR (IV SOLUTION) ×3 IMPLANT

## 2016-01-12 NOTE — Transfer of Care (Signed)
Immediate Anesthesia Transfer of Care Note  Patient: Toni SatoStephanie R Shea  Procedure(s) Performed: Procedure(s): BREAST LUMPECTOMY WITH NEEDLE LOCALIZATION (Right)  Patient Location: PACU  Anesthesia Type:General  Level of Consciousness: responds to stimulation  Airway & Oxygen Therapy: Patient Spontanous Breathing and Patient connected to nasal cannula oxygen  Post-op Assessment: Report given to RN and Post -op Vital signs reviewed and stable  Post vital signs: Reviewed and stable  Last Vitals:  Vitals:   01/12/16 0843 01/12/16 1108  BP: (!) 112/44 (!) 114/59  Pulse: 86 (!) 110  Resp: 16 17  Temp: 36.8 C 36.4 C    Last Pain:  Vitals:   01/12/16 1108  TempSrc: Temporal  PainSc: Asleep         Complications: No apparent anesthesia complications

## 2016-01-12 NOTE — Discharge Instructions (Signed)
Remove dressing in 24 hours. °May shower in 24 hours. °Leave paper strips in place. °Resume all home medications. °Follow-up with Dr. Cooper in 10 days. ° °AMBULATORY SURGERY  °DISCHARGE INSTRUCTIONS ° ° °1) The drugs that you were given will stay in your system until tomorrow so for the next 24 hours you should not: ° °A) Drive an automobile °B) Make any legal decisions °C) Drink any alcoholic beverage ° ° °2) You may resume regular meals tomorrow.  Today it is better to start with liquids and gradually work up to solid foods. ° °You may eat anything you prefer, but it is better to start with liquids, then soup and crackers, and gradually work up to solid foods. ° ° °3) Please notify your doctor immediately if you have any unusual bleeding, trouble breathing, redness and pain at the surgery site, drainage, fever, or pain not relieved by medication. ° ° ° °4) Additional Instructions: ° ° ° ° ° ° ° °Please contact your physician with any problems or Same Day Surgery at [phone], Monday through Friday 6 am to 4 pm, or  at [facility] Main number at [phone]. °

## 2016-01-12 NOTE — Anesthesia Preprocedure Evaluation (Addendum)
Anesthesia Evaluation  Patient identified by MRN, date of birth, ID band Patient awake    Reviewed: Allergy & Precautions, NPO status , Patient's Chart, lab work & pertinent test results  History of Anesthesia Complications (+) PONV  Airway Mallampati: III       Dental   Pulmonary neg pulmonary ROS,           Cardiovascular negative cardio ROS  + Valvular Problems/Murmurs MVP      Neuro/Psych Depression    GI/Hepatic negative GI ROS, Neg liver ROS,   Endo/Other  negative endocrine ROS  Renal/GU negative Renal ROS     Musculoskeletal   Abdominal   Peds  Hematology negative hematology ROS (+)   Anesthesia Other Findings   Reproductive/Obstetrics                             Anesthesia Physical Anesthesia Plan  ASA: II  Anesthesia Plan: General   Post-op Pain Management:    Induction: Intravenous  Airway Management Planned: Nasal Cannula and LMA  Additional Equipment:   Intra-op Plan:   Post-operative Plan:   Informed Consent: I have reviewed the patients History and Physical, chart, labs and discussed the procedure including the risks, benefits and alternatives for the proposed anesthesia with the patient or authorized representative who has indicated his/her understanding and acceptance.     Plan Discussed with:   Anesthesia Plan Comments:        Anesthesia Quick Evaluation

## 2016-01-12 NOTE — Progress Notes (Signed)
Preoperative Review   Patient is met in the preoperative holding area. The history is reviewed in the chart and with the patient. I personally reviewed the options and rationale as well as the risks of this procedure that have been previously discussed with the patient. All questions asked by the patient and/or family were answered to their satisfaction.  Patient agrees to proceed with this procedure at this time.  Richard E Cooper M.D. FACS  

## 2016-01-12 NOTE — Op Note (Signed)
01/12/2016  11:12 AM  PATIENT:  Toni SatoStephanie R Shea  50 y.o. female  PRE-OPERATIVE DIAGNOSIS:  Right mammographic abnormality  POST-OPERATIVE DIAGNOSIS:  Same  PROCEDURE: Right needle localized breast biopsy  SURGEON:  Lattie Hawichard E Ondra Deboard MD, FACS   ANESTHESIA:   Gen. with LMA   Details of Procedure: This a patient with atypical ductal hyperplasia with minimal atypia on a core needle biopsy requiring full excision. We counseled her concerning the risks of bleeding infection recurrence missed lesion additional surgery and cosmetic deformity. This was all reviewed for her and her husband in the preoperative holding area the understood and agreed to proceed.  Findings: Needle localized specimen with specimen mammography demonstrating that the target marker was within the specimen and this was confirmed by specimen mammogram and conveyed to me in the operating room by radiology.  Description of procedure: Patient was induced general anesthesia prepped draped sterile fashion. A surgical pause was performed. Local anesthetic was infiltrated into the skin and subcutaneous tissues tissues around the previously placed needle localization wire. An incision was made dissection down to the end of the wire was performed the specimen was elevated and sent off for examination. Radiology called back stating that the targeted clip was within the specimen. Pathology called back stating that there was no associated mass. Maintained with electrocautery. Additional Marcaine was placed for a total of 30 cc and hemostasis was checked multiple times to ensure that there is no further bleeding. No further bleeding was identified therefore the wound was closed with deep sutures of 3-0 Vicryl followed by 40 septic or Monocryl Steri-Strips Mastisol and sterile dressings were placed  Patient tolerated the Procedure well . she was taken to recovery room in stable condition to be discharged care of her family and follow-up in  10 days.   Lattie Hawichard E Luiz Trumpower, MD FACS

## 2016-01-12 NOTE — Anesthesia Procedure Notes (Signed)
Procedure Name: LMA Insertion Date/Time: 01/12/2016 10:14 AM Performed by: Marlana SalvageJESSUP, Sieanna Vanstone Pre-anesthesia Checklist: Patient identified, Emergency Drugs available, Suction available, Patient being monitored and Timeout performed Patient Re-evaluated:Patient Re-evaluated prior to inductionOxygen Delivery Method: Circle system utilized Preoxygenation: Pre-oxygenation with 100% oxygen Intubation Type: IV induction Ventilation: Mask ventilation without difficulty LMA: LMA inserted LMA Size: 3.0 Number of attempts: 1 Placement Confirmation: positive ETCO2 and breath sounds checked- equal and bilateral Tube secured with: Tape Dental Injury: Teeth and Oropharynx as per pre-operative assessment

## 2016-01-13 LAB — SURGICAL PATHOLOGY

## 2016-01-14 ENCOUNTER — Encounter: Payer: Self-pay | Admitting: Surgery

## 2016-01-14 ENCOUNTER — Telehealth: Payer: Self-pay

## 2016-01-14 NOTE — Telephone Encounter (Signed)
Called patient to let her know that her pathology results showed that she had ATYPICAL DUCTAL HYPERPLASIA per Dr. Excell Seltzerooper. I told patient that she still needs to com in and see Dr. Excell Seltzerooper to further discuss her pathology report. Patient understood and stated that she would come to her appointment on 01/22/2016 at 10:15 AM [facility].

## 2016-01-22 ENCOUNTER — Ambulatory Visit (INDEPENDENT_AMBULATORY_CARE_PROVIDER_SITE_OTHER): Payer: Managed Care, Other (non HMO) | Admitting: Surgery

## 2016-01-22 ENCOUNTER — Encounter: Payer: Self-pay | Admitting: Surgery

## 2016-01-22 ENCOUNTER — Telehealth: Payer: Self-pay

## 2016-01-22 VITALS — BP 102/70 | HR 64 | Temp 97.8°F | Ht 61.0 in | Wt 142.6 lb

## 2016-01-22 DIAGNOSIS — R928 Other abnormal and inconclusive findings on diagnostic imaging of breast: Secondary | ICD-10-CM

## 2016-01-22 NOTE — Addendum Note (Signed)
Addended by: Cameron ProudHILDERS, WINDELLA S on: 01/22/2016 10:57 AM   Modules accepted: Orders

## 2016-01-22 NOTE — Patient Instructions (Signed)
We would like for you to have a 6 month follow up Mammogram (July 2018). I will schedule this along with a follow up to see Dr.Cooper after your mammogram and call you later today with the appointment dates and times.  Amber's contact number is [phone]. Please call our office if you have questions or concerns.

## 2016-01-22 NOTE — Progress Notes (Signed)
Outpatient postop visit  01/22/2016  Tery SanfilippoStephanie R Galano is an 51 y.o. female.    Procedure: Right Needle localized breast biopsy  CC: Minimal tenderness  HPI: This a patient status post needle localized breast biopsy on the right. She had had a core needle biopsy showing atypical ductal hyperplasia and required biopsy. Patient has no pain at this point only minimal discomfort.  Medications reviewed.    Physical Exam:  LMP 01/05/2015 (Approximate)     PE:  Minimal resolving ecchymosis no erythema no drainage nontender   Assessment/Plan:  Final pathology shows atypical ductal hyperplasia no sign of malignancy. Pathology is reviewed with the patient. She clearly has atypical ductal hyperplasia which is been completely excised. I will recommend a right mammogram as a new baseline in June and follow-up at that time.  Of significance in researching the patient's history in the computer chart, someone probably in radiology, placed the diagnosis of ductal carcinoma in situ, DCIS, in this patient's chart. To reiterate the patient has ADH and does not have DCIS and has never had any sign of DCIS. This air or was discussed with the patient. My office nurse has contacted radiology who agreed to make changes to the computer labeling of this patient to ensure that she does not carry the label of DCIS or carcinoma of any kind. This was discussed with the patient and the impact of carrying a diagnosis of DCIS in air was reviewed with her she understood this plan and we will see her back in June. She was also given a contact person to discuss this air with in our office.  Lattie Hawichard E Cooper, MD, FACS

## 2016-01-22 NOTE — Telephone Encounter (Signed)
Spoke with Judeth CornfieldStephanie at this time regarding follow up 6 month mammogram and follow up appointment with Dr.Cooper after the mammogram. Appointment reminder mailed at this time. Patient verbalized understanding.  Mammogram appointment-07-22-16 @ 2:00pm Dr.Cooper appointment- 07-25-16 8:45 am.

## 2016-01-26 NOTE — Anesthesia Postprocedure Evaluation (Signed)
Anesthesia Post Note  Patient: Toni SatoStephanie R Shea  Procedure(s) Performed: Procedure(s) (LRB): BREAST LUMPECTOMY WITH NEEDLE LOCALIZATION (Right)  Patient location during evaluation: PACU Anesthesia Type: General Level of consciousness: awake and alert Pain management: pain level controlled Vital Signs Assessment: post-procedure vital signs reviewed and stable Respiratory status: spontaneous breathing, nonlabored ventilation, respiratory function stable and patient connected to nasal cannula oxygen Cardiovascular status: blood pressure returned to baseline and stable Postop Assessment: no signs of nausea or vomiting Anesthetic complications: no     Last Vitals:  Vitals:   01/12/16 1210 01/12/16 1251  BP: (!) 102/48 (!) 104/52  Pulse: 89 88  Resp: 16 16  Temp: 36.6 C     Last Pain:  Vitals:   01/14/16 0900  TempSrc:   PainSc: 2                  Yevette EdwardsJames G Lucero Ide

## 2016-07-08 ENCOUNTER — Encounter: Payer: Self-pay | Admitting: Internal Medicine

## 2016-07-08 ENCOUNTER — Ambulatory Visit (INDEPENDENT_AMBULATORY_CARE_PROVIDER_SITE_OTHER): Payer: Managed Care, Other (non HMO) | Admitting: Internal Medicine

## 2016-07-08 VITALS — BP 104/58 | HR 94 | Ht 61.5 in | Wt 142.0 lb

## 2016-07-08 DIAGNOSIS — M25572 Pain in left ankle and joints of left foot: Secondary | ICD-10-CM | POA: Diagnosis not present

## 2016-07-08 NOTE — Progress Notes (Signed)
    Date:  07/08/2016   Name:  Toni Shea   DOB:  [birth year]   MRN:  [id number]   Chief Complaint: Joint Swelling (X 2 week ago. L) Ankle swollen. Fiance noticed and she had no pain and didn't realized until he mentioned it. No injury to ankle. Used ice and took some of it away but ice made it painful. Its on and off pain. Feels like fluid is built up on ankle. Monday leg started throbbing non stop.  )    Review of Systems  Constitutional: Negative for chills, fatigue and fever.  Respiratory: Negative for chest tightness and shortness of breath.   Cardiovascular: Negative for chest pain and palpitations.  Endocrine: Negative for polydipsia and polyuria.  Genitourinary: Negative for dysuria.  Musculoskeletal: Positive for arthralgias and joint swelling.  Skin: Negative for color change and rash.    Patient Active Problem List   Diagnosis Date Noted  . Abnormal mammogram 12/16/2015  . 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 Menopause 11/07/2014  . Constipation 11/07/2014  . FH: melanoma 11/07/2014    Prior to Admission medications   Medication Sig Start Date End Date Taking? Authorizing Provider  ibuprofen (ADVIL,MOTRIN) 200 MG tablet Take 600 mg by mouth every 8 (eight) hours as needed (for pain.).   Yes [provider]  Omega-3 Fatty Acids (FISH OIL PO) Take 250 mg by mouth daily.   Yes [provider]    Allergies  Allergen Reactions  . Other     Anesthesia--nausea/vomiting & migraine headache  . Paxil [Paroxetine Hcl] Palpitations    Past Surgical History:  Procedure Laterality Date  . BREAST LUMPECTOMY WITH NEEDLE LOCALIZATION Right 01/12/2016   Procedure: BREAST LUMPECTOMY WITH NEEDLE LOCALIZATION;  Surgeon: Lattie Hawichard E Cooper, MD;  Location: [facility] ORS;  Service: General;  Laterality: Right;  . CESAREAN SECTION    . LITHOTRIPSY    . TUBAL LIGATION      Social History  Substance Use Topics  . Smoking status: Never Smoker  . Smokeless tobacco:  Never Used  . Alcohol use 0.0 oz/week     Comment: RARE     Medication list has been reviewed and updated.   Physical Exam  Constitutional: She is oriented to person, place, and time. She appears well-developed. No distress.  HENT:  Head: Normocephalic and atraumatic.  Cardiovascular: Normal rate, regular rhythm and normal heart sounds.   Pulmonary/Chest: Effort normal and breath sounds normal. No respiratory distress. She has no wheezes.  Musculoskeletal: Normal range of motion.       Feet:  Tender achilles tendon Tender over lateral malleolus  Neurological: She is alert and oriented to person, place, and time.  Skin: Skin is warm and dry. No rash noted.  Psychiatric: She has a normal mood and affect. Her behavior is normal. Thought content normal.  Nursing note and vitals reviewed.   Ht 5' 1.5" (1.562 m)   Wt 142 lb (64.4 kg)   LMP 01/05/2015 (Approximate)   BMI 26.40 kg/m   Assessment and Plan: 1. Arthralgia of left ankle With mild edema likely due to tendonitis Recommend soft support, advil 400 mg tid Elevate when possible   No orders of the defined types were placed in this encounter.   Bari EdwardLaura Ronia Hazelett, MD [facility]  07/08/2016

## 2016-07-08 NOTE — Patient Instructions (Signed)
Advil 400 mg three times a day  Compression sleeve for ankle to wear during the day  Elevate as much as possible

## 2016-07-22 ENCOUNTER — Telehealth: Payer: Self-pay

## 2016-07-22 ENCOUNTER — Ambulatory Visit
Admission: RE | Admit: 2016-07-22 | Discharge: 2016-07-22 | Disposition: A | Discharge status: Home / Self Care | Payer: Managed Care, Other (non HMO) | Source: Ambulatory Visit | Attending: Surgery | Admitting: Surgery

## 2016-07-22 ENCOUNTER — Other Ambulatory Visit: Payer: Self-pay | Admitting: Surgery

## 2016-07-22 DIAGNOSIS — Z9889 Other specified postprocedural states: Secondary | ICD-10-CM | POA: Insufficient documentation

## 2016-07-22 DIAGNOSIS — R928 Other abnormal and inconclusive findings on diagnostic imaging of breast: Secondary | ICD-10-CM

## 2016-07-22 DIAGNOSIS — N6089 Other benign mammary dysplasias of unspecified breast: Secondary | ICD-10-CM | POA: Diagnosis present

## 2016-07-22 NOTE — Telephone Encounter (Signed)
Patient notified of Mammogram and follow up appointment on 08/19/16.

## 2016-07-25 ENCOUNTER — Ambulatory Visit: Payer: Self-pay | Admitting: Surgery

## 2016-08-10 ENCOUNTER — Ambulatory Visit
Admission: RE | Admit: 2016-08-10 | Discharge: 2016-08-10 | Disposition: A | Discharge status: Home / Self Care | Payer: Managed Care, Other (non HMO) | Source: Ambulatory Visit | Attending: Internal Medicine | Admitting: Internal Medicine

## 2016-08-10 ENCOUNTER — Ambulatory Visit (INDEPENDENT_AMBULATORY_CARE_PROVIDER_SITE_OTHER): Payer: Managed Care, Other (non HMO) | Admitting: Internal Medicine

## 2016-08-10 ENCOUNTER — Other Ambulatory Visit: Payer: Self-pay | Admitting: Internal Medicine

## 2016-08-10 ENCOUNTER — Encounter: Payer: Self-pay | Admitting: Internal Medicine

## 2016-08-10 VITALS — BP 98/62 | HR 78 | Ht 61.5 in | Wt 141.0 lb

## 2016-08-10 DIAGNOSIS — M25572 Pain in left ankle and joints of left foot: Secondary | ICD-10-CM | POA: Insufficient documentation

## 2016-08-10 DIAGNOSIS — R600 Localized edema: Secondary | ICD-10-CM

## 2016-08-10 DIAGNOSIS — M7989 Other specified soft tissue disorders: Secondary | ICD-10-CM

## 2016-08-10 DIAGNOSIS — G8929 Other chronic pain: Secondary | ICD-10-CM

## 2016-08-10 NOTE — Progress Notes (Signed)
Date:  08/10/2016   Name:  Toni SanfilippoStephanie R Shea   DOB:  11/01/1965   MRN:  [id number]   Chief Complaint: Joint Swelling (left Swelling ankle- still puffy. Not improved since last visit. Periodically starts throbbing and hurting. No injury to ankle. Hurting and tight in shin area. When it hurts it feels like circulation is being cut off. Worried about having blood clot. ) Seen a few weeks ago with no change from elevation, advil and compression. No redness or warmth - just persistent lateral localized swelling of ankle and swelling of fore foot with tight sensation and discomfort.   Review of Systems  Constitutional: Negative for chills, fatigue and fever.  Respiratory: Negative for chest tightness, shortness of breath and wheezing.   Cardiovascular: Positive for leg swelling. Negative for chest pain and palpitations.  Gastrointestinal: Negative for abdominal pain.  Musculoskeletal: Positive for arthralgias. Negative for gait problem.    Patient Active Problem List   Diagnosis Date Noted  . Abnormal mammogram 12/16/2015  . 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 Menopause 11/07/2014  . Constipation 11/07/2014  . FH: melanoma 11/07/2014    Prior to Admission medications   Medication Sig Start Date End Date Taking? Authorizing Provider  ibuprofen (ADVIL,MOTRIN) 200 MG tablet Take 600 mg by mouth every 8 (eight) hours as needed (for pain.).   Yes [provider]    Allergies  Allergen Reactions  . Other     Anesthesia--nausea/vomiting & migraine headache  . Paxil [Paroxetine Hcl] Palpitations    Past Surgical History:  Procedure Laterality Date  . BREAST BIOPSY Right 12/03/2015   atypical ductal hyperplasia  . BREAST CYST EXCISION Right    Atypical ductal hyperplasia  . BREAST LUMPECTOMY WITH NEEDLE LOCALIZATION Right 01/12/2016   Procedure: BREAST LUMPECTOMY WITH NEEDLE LOCALIZATION;  Surgeon: Lattie Hawichard E Cooper, MD;  Location: [facility] ORS;  Service: General;  Laterality:  Right;  . CESAREAN SECTION    . LITHOTRIPSY    . TUBAL LIGATION      Social History  Substance Use Topics  . Smoking status: Never Smoker  . Smokeless tobacco: Never Used  . Alcohol use 0.0 oz/week     Comment: RARE     Medication list has been reviewed and updated.   Physical Exam  Constitutional: She is oriented to person, place, and time. She appears well-developed. No distress.  HENT:  Head: Normocephalic and atraumatic.  Cardiovascular: Normal rate, regular rhythm and normal heart sounds.   Pulmonary/Chest: Effort normal and breath sounds normal. No respiratory distress.  Musculoskeletal: Normal range of motion.       Legs:      Feet:  Neurological: She is alert and oriented to person, place, and time.  Skin: Skin is warm and dry. No rash noted.  Psychiatric: She has a normal mood and affect. Her behavior is normal. Thought content normal.  Nursing note and vitals reviewed.   BP 98/62   Pulse 78   Ht 5' 1.5" (1.562 m)   Wt 141 lb (64 kg)   LMP 01/05/2015 (Approximate)   SpO2 99%   BMI 26.21 kg/m   Assessment and Plan: 1. Localized edema Patient reassured that there is no evidence of DVT or infection - Basic metabolic panel - TSH  2. Acute left ankle pain - CBC with Differential/Platelet - Sedimentation rate - DG Ankle Complete Left; Future - DG Foot Complete Left; Future  Will likely need podiatry evaluation.  No orders of the defined types were placed in  this encounter.   Bari EdwardLaura Sedona Wenk, MD [facility]  08/10/2016

## 2016-08-11 LAB — CBC WITH DIFFERENTIAL/PLATELET
BASOS: 0 %
Basophils Absolute: 0 10*3/uL (ref 0.0–0.2)
EOS (ABSOLUTE): 0.1 10*3/uL (ref 0.0–0.4)
EOS: 2 %
HEMOGLOBIN: 12.5 g/dL (ref 11.1–15.9)
Hematocrit: 36.4 % (ref 34.0–46.6)
IMMATURE GRANS (ABS): 0 10*3/uL (ref 0.0–0.1)
IMMATURE GRANULOCYTES: 0 %
LYMPHS: 50 %
Lymphocytes Absolute: 2.9 10*3/uL (ref 0.7–3.1)
MCH: 31.3 pg (ref 26.6–33.0)
MCHC: 34.3 g/dL (ref 31.5–35.7)
MCV: 91 fL (ref 79–97)
MONOCYTES: 7 %
Monocytes Absolute: 0.4 10*3/uL (ref 0.1–0.9)
NEUTROS PCT: 41 %
Neutrophils Absolute: 2.3 10*3/uL (ref 1.4–7.0)
PLATELETS: 235 10*3/uL (ref 150–379)
RBC: 4 x10E6/uL (ref 3.77–5.28)
RDW: 12.8 % (ref 12.3–15.4)
WBC: 5.7 10*3/uL (ref 3.4–10.8)

## 2016-08-11 LAB — BASIC METABOLIC PANEL
BUN/Creatinine Ratio: 16 (ref 9–23)
BUN: 12 mg/dL (ref 6–24)
CO2: 23 mmol/L (ref 20–29)
CREATININE: 0.74 mg/dL (ref 0.57–1.00)
Calcium: 9.1 mg/dL (ref 8.7–10.2)
Chloride: 106 mmol/L (ref 96–106)
GFR calc Af Amer: 110 mL/min/{1.73_m2} (ref >59)
GFR, EST NON AFRICAN AMERICAN: 95 mL/min/{1.73_m2} (ref >59)
GLUCOSE: 86 mg/dL (ref 65–99)
Potassium: 3.8 mmol/L (ref 3.5–5.2)
Sodium: 142 mmol/L (ref 134–144)

## 2016-08-11 LAB — SEDIMENTATION RATE: SED RATE: 7 mm/h (ref 0–32)

## 2016-08-11 LAB — TSH: TSH: 0.877 u[IU]/mL (ref 0.450–4.500)

## 2016-08-19 ENCOUNTER — Encounter: Payer: Self-pay | Admitting: Surgery

## 2016-08-19 ENCOUNTER — Ambulatory Visit (INDEPENDENT_AMBULATORY_CARE_PROVIDER_SITE_OTHER): Payer: Managed Care, Other (non HMO) | Admitting: Surgery

## 2016-08-19 VITALS — BP 100/65 | HR 96 | Temp 98.1°F | Ht 61.5 in | Wt 141.2 lb

## 2016-08-19 DIAGNOSIS — R928 Other abnormal and inconclusive findings on diagnostic imaging of breast: Secondary | ICD-10-CM | POA: Diagnosis not present

## 2016-08-19 NOTE — Patient Instructions (Signed)
Dr. Excell Seltzerooper will review your results and contact you if there are any results to be concerned about.  Your annual screening is recommended for 09/2016.  Please call our office if you have any further questions or concerns.

## 2016-08-19 NOTE — Progress Notes (Signed)
Outpatient Surgical Follow Up  08/19/2016  Toni Shea is an 51 y.o. female.   CC:abn mammogram  HPI: This patient with a history of atypical ductal hyperplasia and prior biopsies. She is here for follow-up. She had a mammogram which shows no acute changes and no suspicious lesions. Patient has no complaints at this time no new masses no discharges no new medical problems no new medications. She did have some swelling of her left ankle which was worked up and negative. Past Medical History:  Diagnosis Date  . Complication of anesthesia   . Headache    MIGRAINES  . Heart murmur   . History of kidney stones   . Mitral valve prolapse     Past Surgical History:  Procedure Laterality Date  . BREAST BIOPSY Right 12/03/2015   atypical ductal hyperplasia  . BREAST CYST EXCISION Right    Atypical ductal hyperplasia  . BREAST LUMPECTOMY WITH NEEDLE LOCALIZATION Right 01/12/2016   Procedure: BREAST LUMPECTOMY WITH NEEDLE LOCALIZATION;  Surgeon: Lattie Hawichard E Cooper, MD;  Location: [facility] ORS;  Service: General;  Laterality: Right;  . CESAREAN SECTION    . LITHOTRIPSY    . TUBAL LIGATION      Family History  Problem Relation Age of Onset  . Cancer Mother        Melanoma  . Breast cancer Paternal Grandmother     Social History:  reports that she has never smoked. She has never used smokeless tobacco. She reports that she drinks alcohol. She reports that she does not use drugs.  Allergies:  Allergies  Allergen Reactions  . Other     Anesthesia--nausea/vomiting & migraine headache  . Paxil [Paroxetine Hcl] Palpitations    Medications reviewed.   Review of Systems:   Review of Systems  Constitutional: Negative.   HENT: Negative.   Eyes: Negative.   Respiratory: Negative.   Cardiovascular: Negative.   Gastrointestinal: Negative.   Genitourinary: Negative.   Musculoskeletal: Negative.   Skin: Negative.   Neurological: Negative.   Endo/Heme/Allergies: Negative.     Psychiatric/Behavioral: Negative.      Physical Exam:  Ht 5' 1.5" (1.562 m)   LMP 01/05/2015 (Approximate) Comment: denies preg  Physical Exam  Constitutional: She is oriented to person, place, and time and well-developed, well-nourished, and in no distress. No distress.  HENT:  Head: Normocephalic and atraumatic.  Eyes: Right eye exhibits no discharge. Left eye exhibits no discharge. No scleral icterus.  Neck: No JVD present.  Pulmonary/Chest: Effort normal. No respiratory distress.    Lymphadenopathy:    She has no cervical adenopathy.  Neurological: She is alert and oriented to person, place, and time.  Skin: Skin is warm and dry. She is not diaphoretic. No erythema.  Vitals reviewed.   Breast exam bilaterally demonstrates a scar in the upper inner quadrant of the right breast no masses no axillary adenopathy. Left breast shows no masses no axillary adenopathy no discharge.  No results found for this or any previous visit (from the past 48 hour(s)). No results found.  Assessment/Plan:  Mammogram is reviewed. I concur with the results that recommend screening once a year. I discussed with the patient the high risk or higher risk group of ADH with a family history of breast cancer in her grandmother. She agrees to be diligent about self exams and follow-up yearly. She will see me after her next mammogram in September 2019  Lattie Hawichard E Cooper, MD, FACS

## 2016-08-25 ENCOUNTER — Telehealth: Payer: Self-pay

## 2016-08-25 ENCOUNTER — Other Ambulatory Visit: Payer: Self-pay | Admitting: Surgery

## 2016-08-25 ENCOUNTER — Other Ambulatory Visit: Payer: Self-pay

## 2016-08-25 DIAGNOSIS — Z9889 Other specified postprocedural states: Secondary | ICD-10-CM

## 2016-08-25 DIAGNOSIS — Z1231 Encounter for screening mammogram for malignant neoplasm of breast: Secondary | ICD-10-CM

## 2016-08-25 NOTE — Telephone Encounter (Signed)
Call made to [facility] at this time to schedule annual mammogram appointment for the patient. Spoke with Maralyn SagoSarah and was able to get the patient scheduled for 3D Mammogram for Monday 9/17 arriving at 9:10AM. I will contact patient with this information.  Call made to patient at this time to let her know that I have scheduled her annual mammogram for Monday Sept. 17th with arrival at 9:10AM at the [facility]. I was unable to leave a message due to patient not having a voicemail setup.

## 2016-08-26 ENCOUNTER — Other Ambulatory Visit: Payer: Self-pay

## 2016-08-26 ENCOUNTER — Telehealth: Payer: Self-pay

## 2016-08-26 NOTE — Telephone Encounter (Signed)
Called [facility] to schedule patients annual mammogram. I was able to get the appointment schedule for 9/21 at 9AM. I will contact patient with this appointment.  Call made to patient at this time. I informed her that her appointment has been scheduled for 9/21 at 9AM at the [facility]. Patient verbalized understanding.

## 2016-08-26 NOTE — Telephone Encounter (Signed)
Called patient at this time to inform patient of her annual screening for her mammogram. She stated that she would be unable to go to [place] and that she needs certain days. She gave me those dates and I told her I would try to get it rescheduled to at least here in [place] but not hundred percent sure if I will be able to get those dates. I told the patient I would call her back with this information. Patient verbalized understanding.  Call made to [facility] at this time and [facility] I told her that I need cancel an appointment for the patient due to the inconvenience.

## 2016-10-03 ENCOUNTER — Ambulatory Visit: Payer: Managed Care, Other (non HMO)

## 2016-10-07 ENCOUNTER — Ambulatory Visit
Admission: RE | Admit: 2016-10-07 | Discharge: 2016-10-07 | Disposition: A | Discharge status: Home / Self Care | Payer: Managed Care, Other (non HMO) | Source: Ambulatory Visit | Attending: Surgery | Admitting: Surgery

## 2016-10-07 DIAGNOSIS — Z1231 Encounter for screening mammogram for malignant neoplasm of breast: Secondary | ICD-10-CM | POA: Insufficient documentation

## 2016-11-10 ENCOUNTER — Encounter: Payer: Self-pay | Admitting: Surgery

## 2016-11-10 ENCOUNTER — Ambulatory Visit (INDEPENDENT_AMBULATORY_CARE_PROVIDER_SITE_OTHER): Payer: Managed Care, Other (non HMO) | Admitting: Surgery

## 2016-11-10 ENCOUNTER — Telehealth: Payer: Self-pay

## 2016-11-10 VITALS — BP 111/78 | HR 86 | Temp 98.2°F | Ht 61.0 in | Wt 142.8 lb

## 2016-11-10 DIAGNOSIS — R928 Other abnormal and inconclusive findings on diagnostic imaging of breast: Secondary | ICD-10-CM | POA: Diagnosis not present

## 2016-11-10 DIAGNOSIS — N6092 Unspecified benign mammary dysplasia of left breast: Secondary | ICD-10-CM

## 2016-11-10 NOTE — Progress Notes (Signed)
Outpatient Surgical Follow Up  11/10/2016  Toni Shea is an 51 y.o. female.   CC:ADH  HPI: With atypical ductal hyperplasia.  She is here for review of a new mammogram performed bilaterally last month.  Past Medical History:  Diagnosis Date  . Complication of anesthesia   . Headache    MIGRAINES  . Heart murmur   . History of kidney stones   . Mitral valve prolapse     Past Surgical History:  Procedure Laterality Date  . BREAST BIOPSY Right 12/03/2015   atypical ductal hyperplasia  . BREAST CYST EXCISION Right    Atypical ductal hyperplasia  . BREAST LUMPECTOMY WITH NEEDLE LOCALIZATION Right 01/12/2016   Procedure: BREAST LUMPECTOMY WITH NEEDLE LOCALIZATION;  Surgeon: Lattie Haw, MD;  Location: [facility] ORS;  Service: General;  Laterality: Right;  . CESAREAN SECTION    . LITHOTRIPSY    . TUBAL LIGATION      Family History  Problem Relation Age of Onset  . Cancer Mother        Melanoma  . Breast cancer Paternal Grandmother     Social History:  reports that she has never smoked. She has never used smokeless tobacco. She reports that she drinks alcohol. She reports that she does not use drugs.  Allergies:  Allergies  Allergen Reactions  . Other     Anesthesia--nausea/vomiting & migraine headache  . Paxil [Paroxetine Hcl] Palpitations    Medications reviewed.   Review of Systems:   Review of Systems  Constitutional: Negative.   HENT: Negative.   Eyes: Negative.   Respiratory: Negative.   Cardiovascular: Negative.   Gastrointestinal: Negative.   Genitourinary: Negative.   Musculoskeletal: Negative.   Skin: Negative.   Neurological: Negative.   Endo/Heme/Allergies: Negative.   Psychiatric/Behavioral: Negative.      Physical Exam:  BP 111/78   Pulse 86   Temp 98.2 F (36.8 C) (Oral)   Ht 5\' 1"  (1.549 m)   Wt 142 lb 12.8 oz (64.8 kg)   LMP 01/05/2015 (Approximate) Comment: denies preg  BMI 26.98 kg/m   Physical Exam   Constitutional: She is oriented to person, place, and time and well-developed, well-nourished, and in no distress. No distress.  HENT:  Head: Normocephalic and atraumatic.  Eyes: Pupils are equal, round, and reactive to light. Right eye exhibits no discharge. Left eye exhibits no discharge. No scleral icterus.  Neck: Normal range of motion.  Cardiovascular: Normal rate, regular rhythm and normal heart sounds.   Pulmonary/Chest: Effort normal and breath sounds normal. No respiratory distress. She has no wheezes.  Abdominal: Soft. She exhibits no distension.  Musculoskeletal: Normal range of motion. She exhibits no edema or tenderness.  Lymphadenopathy:    She has no cervical adenopathy.  Neurological: She is alert and oriented to person, place, and time.  Skin: Skin is warm and dry. No rash noted. She is not diaphoretic. No erythema.  Psychiatric: Mood and affect normal.  Vitals reviewed.   Breast exam: Scar in right upper inner quadrant without mass.  Well-healed no cosmetic problems.  No axillary adenopathy. Breasts demonstrates no mass no discharge etc.  No results found for this or any previous visit (from the past 48 hour(s)). No results found.  Assessment/Plan:  Mammogram personally reviewed.  No sign of malignancy.  Recommend yearly follow-up.  Reminded her to do good self exams and reminded her of her high risk status due to family history and ADH.  She is doing diligent self exams and  will follow up in 1 year or sooner if she notices anything different.  Lattie Hawichard E Dee Paden, MD, FACS

## 2016-11-10 NOTE — Telephone Encounter (Signed)
Patient notified of Mammogram appointment 10/13/2017 and reminder mailed as well.   I will call patient at a later time to schedule a follow up appointment with Dr.Cooper after she has her mammogram.

## 2016-11-10 NOTE — Patient Instructions (Signed)
We will call you with your Mammogram and follow up appointment  Once we get the Mammogram scheduled.  Please continue to do self breast exams monthly. Breast Self-Awareness Breast self-awareness means:  Knowing how your breasts look.  Knowing how your breasts feel.  Checking your breasts every month for changes.  Telling your doctor if you notice a change in your breasts.  Breast self-awareness allows you to notice a breast problem early while it is still small. How to do a breast self-exam One way to learn what is normal for your breasts and to check for changes is to do a breast self-exam. To do a breast self-exam: Look for Changes  1. Take off all the clothes above your waist. 2. Stand in front of a mirror in a room with good lighting. 3. Put your hands on your hips. 4. Push your hands down. 5. Look at your breasts and nipples in the mirror to see if one breast or nipple looks different than the other. Check to see if: ? The shape of one breast is different. ? The size of one breast is different. ? There are wrinkles, dips, and bumps in one breast and not the other. 6. Look at each breast for changes in your skin, such as: ? Redness. ? Scaly areas. 7. Look for changes in your nipples, such as: ? Liquid around the nipples. ? Bleeding. ? Dimpling. ? Redness. ? A change in where the nipples are. Feel for Changes 1. Lie on your back on the floor. 2. Feel each breast. To do this, follow these steps: ? Pick a breast to feel. ? Put the arm closest to that breast above your head. ? Use your other arm to feel the nipple area of your breast. Feel the area with the pads of your three middle fingers by making small circles with your fingers. For the first circle, press lightly. For the second circle, press harder. For the third circle, press even harder. ? Keep making circles with your fingers at the light, harder, and even harder pressures as you move down your breast. Stop when you  feel your ribs. ? Move your fingers a little toward the center of your body. ? Start making circles with your fingers again, this time going up until you reach your collarbone. ? Keep making up and down circles until you reach your armpit. Remember to keep using the three pressures. ? Feel the other breast in the same way. 3. Sit or stand in the shower or tub. 4. With soapy water on your skin, feel each breast the same way you did in step 2, when you were lying on the floor. Write Down What You Find  After doing the self-exam, write down:  What is normal for each breast.  Any changes you find in each breast.  When you last had your period.  How often should I check my breasts? Check your breasts every month. If you are breastfeeding, the best time to check them is after you feed your baby or after you use a breast pump. If you get periods, the best time to check your breasts is 5-7 days after your period is over. When should I see my doctor? See your doctor if you notice:  A change in shape or size of your breasts or nipples.  A change in the skin of your breast or nipples, such as red or scaly skin.  Unusual fluid coming from your nipples.  A lump or thick  area that was not there before.  Pain in your breasts.  Anything that concerns you.  This information is not intended to replace advice given to you by your health care provider. Make sure you discuss any questions you have with your health care provider. Document Released: 06/22/2007 Document Revised: 06/11/2015 Document Reviewed: 11/23/2014 Elsevier Interactive Patient Education  [employer].

## 2016-11-13 ENCOUNTER — Encounter: Payer: Self-pay | Admitting: Internal Medicine

## 2017-10-13 ENCOUNTER — Ambulatory Visit
Admission: RE | Admit: 2017-10-13 | Discharge: 2017-10-13 | Disposition: A | Discharge status: Home / Self Care | Payer: Managed Care, Other (non HMO) | Source: Ambulatory Visit | Attending: Surgery | Admitting: Surgery

## 2017-10-13 DIAGNOSIS — N6092 Unspecified benign mammary dysplasia of left breast: Secondary | ICD-10-CM | POA: Insufficient documentation

## 2017-10-16 ENCOUNTER — Telehealth: Payer: Self-pay

## 2017-10-16 ENCOUNTER — Ambulatory Visit: Payer: Self-pay | Admitting: Surgery

## 2017-10-16 NOTE — Telephone Encounter (Signed)
Patient notified of mammogram results and reminded of 10/18/17 appointment with Dr.Pabon.

## 2017-10-18 ENCOUNTER — Ambulatory Visit: Payer: Managed Care, Other (non HMO) | Admitting: Surgery

## 2017-10-18 ENCOUNTER — Encounter: Payer: Self-pay | Admitting: Surgery

## 2017-10-18 VITALS — BP 123/79 | HR 96 | Temp 97.9°F | Resp 18 | Ht 61.5 in | Wt 130.6 lb

## 2017-10-18 DIAGNOSIS — N6099 Unspecified benign mammary dysplasia of unspecified breast: Secondary | ICD-10-CM

## 2017-10-18 NOTE — Patient Instructions (Addendum)
We will call you in one year to schedule your screening mammogram.    reast Self-Awareness Breast self-awareness means:  Knowing how your breasts look.  Knowing how your breasts feel.  Checking your breasts every month for changes.  Telling your doctor if you notice a change in your breasts.  Breast self-awareness allows you to notice a breast problem early while it is still small. How to do a breast self-exam One way to learn what is normal for your breasts and to check for changes is to do a breast self-exam. To do a breast self-exam: Look for Changes  1. Take off all the clothes above your waist. 2. Stand in front of a mirror in a room with good lighting. 3. Put your hands on your hips. 4. Push your hands down. 5. Look at your breasts and nipples in the mirror to see if one breast or nipple looks different than the other. Check to see if: ? The shape of one breast is different. ? The size of one breast is different. ? There are wrinkles, dips, and bumps in one breast and not the other. 6. Look at each breast for changes in your skin, such as: ? Redness. ? Scaly areas. 7. Look for changes in your nipples, such as: ? Liquid around the nipples. ? Bleeding. ? Dimpling. ? Redness. ? A change in where the nipples are. Feel for Changes 1. Lie on your back on the floor. 2. Feel each breast. To do this, follow these steps: ? Pick a breast to feel. ? Put the arm closest to that breast above your head. ? Use your other arm to feel the nipple area of your breast. Feel the area with the pads of your three middle fingers by making small circles with your fingers. For the first circle, press lightly. For the second circle, press harder. For the third circle, press even harder. ? Keep making circles with your fingers at the light, harder, and even harder pressures as you move down your breast. Stop when you feel your ribs. ? Move your fingers a little toward the center of your  body. ? Start making circles with your fingers again, this time going up until you reach your collarbone. ? Keep making up and down circles until you reach your armpit. Remember to keep using the three pressures. ? Feel the other breast in the same way. 3. Sit or stand in the shower or tub. 4. With soapy water on your skin, feel each breast the same way you did in step 2, when you were lying on the floor. Write Down What You Find  After doing the self-exam, write down:  What is normal for each breast.  Any changes you find in each breast.  When you last had your period.  How often should I check my breasts? Check your breasts every month. If you are breastfeeding, the best time to check them is after you feed your baby or after you use a breast pump. If you get periods, the best time to check your breasts is 5-7 days after your period is over. When should I see my doctor? See your doctor if you notice:  A change in shape or size of your breasts or nipples.  A change in the skin of your breast or nipples, such as red or scaly skin.  Unusual fluid coming from your nipples.  A lump or thick area that was not there before.  Pain in your breasts.  Anything that concerns you.  This information is not intended to replace advice given to you by your health care provider. Make sure you discuss any questions you have with your health care provider. Document Released: 06/22/2007 Document Revised: 06/11/2015 Document Reviewed: 11/23/2014 Elsevier Interactive Patient Education  [employer].

## 2017-10-18 NOTE — Progress Notes (Signed)
Outpatient Surgical Follow Up  10/18/2017  Toni Shea is an 52 y.o. female.   Chief Complaint  Patient presents with  . Follow-up    Breast exam - discuss mammogram    HPI: Toni Shea is a 52 year old female with a history of ADH status post lumpectomy on December 2017 by Dr. Excell Seltzer.  She is following up for a breast exam and to discuss mammogram .  I have personally reviewed the last mammogram showing no evidence of any abnormalities.  Patient denies any concerns.  No lumps.  No issues with the breast at all.  She has healed very well from a previous lumpectomy  Past Medical History:  Diagnosis Date  . Complication of anesthesia   . Headache    MIGRAINES  . Heart murmur   . History of kidney stones   . Mitral valve prolapse     Past Surgical History:  Procedure Laterality Date  . BREAST BIOPSY Right 12/03/2015   atypical ductal hyperplasia   . BREAST EXCISIONAL BIOPSY Right 01/12/2016   ADH removed  . BREAST LUMPECTOMY WITH NEEDLE LOCALIZATION Right 01/12/2016   Procedure: BREAST LUMPECTOMY WITH NEEDLE LOCALIZATION;  Surgeon: Lattie Haw, MD;  Location: [facility] ORS;  Service: General;  Laterality: Right;  . CESAREAN SECTION    . LITHOTRIPSY    . TUBAL LIGATION      Family History  Problem Relation Age of Onset  . Cancer Mother        Melanoma  . Breast cancer Paternal Grandmother     Social History:  reports that she has never smoked. She has never used smokeless tobacco. She reports that she drinks alcohol. She reports that she does not use drugs.  Allergies:  Allergies  Allergen Reactions  . Other     Anesthesia--nausea/vomiting & migraine headache  . Paxil [Paroxetine Hcl] Palpitations    Medications reviewed.    ROS Full ROS performed and is otherwise negative other than what is stated in HPI   BP 123/79   Pulse 96   Temp 97.9 F (36.6 C) (Temporal)   Resp 18   Ht 5' 1.5" (1.562 m)   Wt 130 lb 9.6 oz (59.2 kg)   LMP 01/05/2015  (Approximate) Comment: denies preg  SpO2 98%   BMI 24.28 kg/m   Physical Exam  Constitutional: She is oriented to person, place, and time. She appears well-developed and well-nourished. No distress.  Eyes: Conjunctivae and EOM are normal. Right eye exhibits no discharge. Left eye exhibits no discharge.  Neck: Normal range of motion. Neck supple. No JVD present. No tracheal deviation present. No thyromegaly present.  Pulmonary/Chest: Effort normal. No respiratory distress.  BREAST: Right lumpectomy scar. No masses, no LAD. No other lesions.  Abdominal: Soft. She exhibits no distension and no mass. There is no tenderness. There is no guarding.  Neurological: She is alert and oriented to person, place, and time. No cranial nerve deficit. Coordination normal.  Skin: Skin is warm and dry. She is not diaphoretic.  Psychiatric: She has a normal mood and affect. Her behavior is normal. Judgment and thought content normal.  Nursing note and vitals reviewed.  Assessment/Plan: HX ADH and lumpectomy.  Doing well.  No further surgical therapy.  We will continue yearly exams and mammogram.  Return to clinic in 1 year.  Sterling Big, MD [facility] General Surgeon

## 2017-12-23 IMAGING — MG MM BREAST BX W/ LOC DEV 1ST LESION IMAGE BX SPEC STEREO GUIDE*R*
5 series · 6 of 21 positions shown · non-contrast
Comparison: Previous exams.

ADDENDUM:
Pathology results: Pathology results from the stereotactic guided
biopsy of the calcifications in the central upper right breast
demonstrates atypical ductal hyperplasia with associated
calcifications as well as flat epithelial atypia. This is concordant
with the imaging findings. The patient has been notified of the
results. Other than soreness she is doing well and denies any biopsy
site complications.

Surgical consultation to discuss excision is recommended. The
patient will receive a phone call from the nurse navigator with a
surgical appointment. The patient has been instructed to call the
CLINICAL DATA: 42-year-old female with indeterminate calcifications
in the central upper right breast.
EXAM:
RIGHT BREAST STEREOTACTIC CORE NEEDLE BIOPSY

[R CC tomo · 2 of 65 frames shown (1 of 4)]
[frame 21/65]
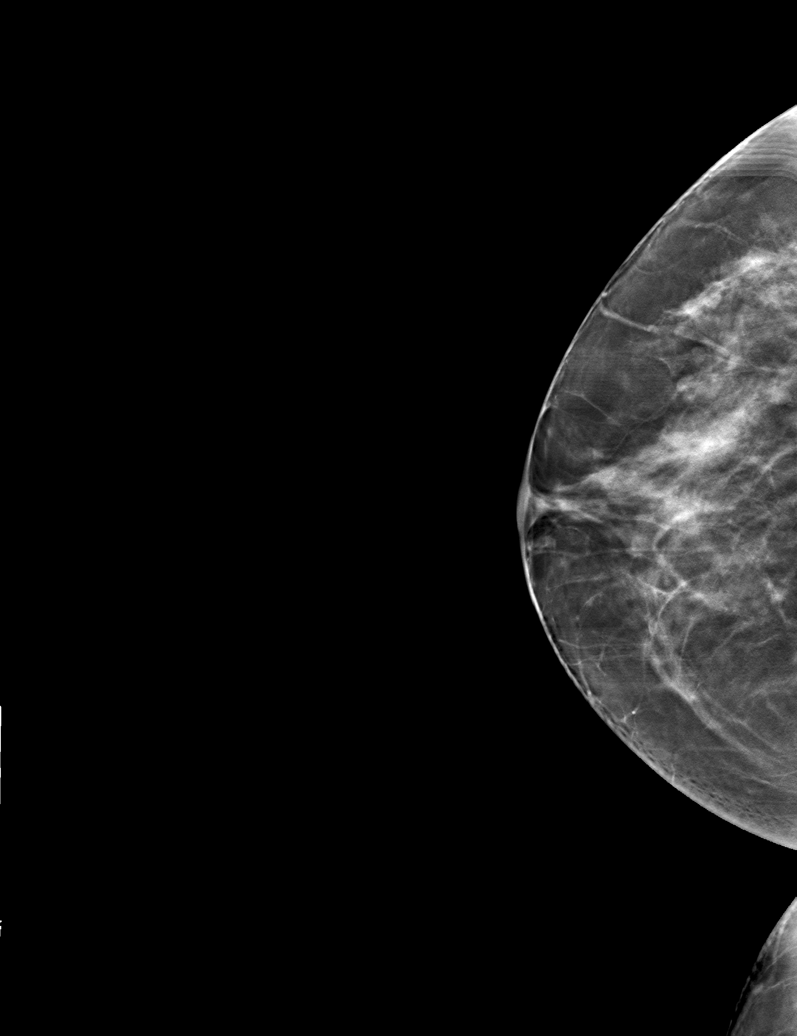
[frame 33/65]
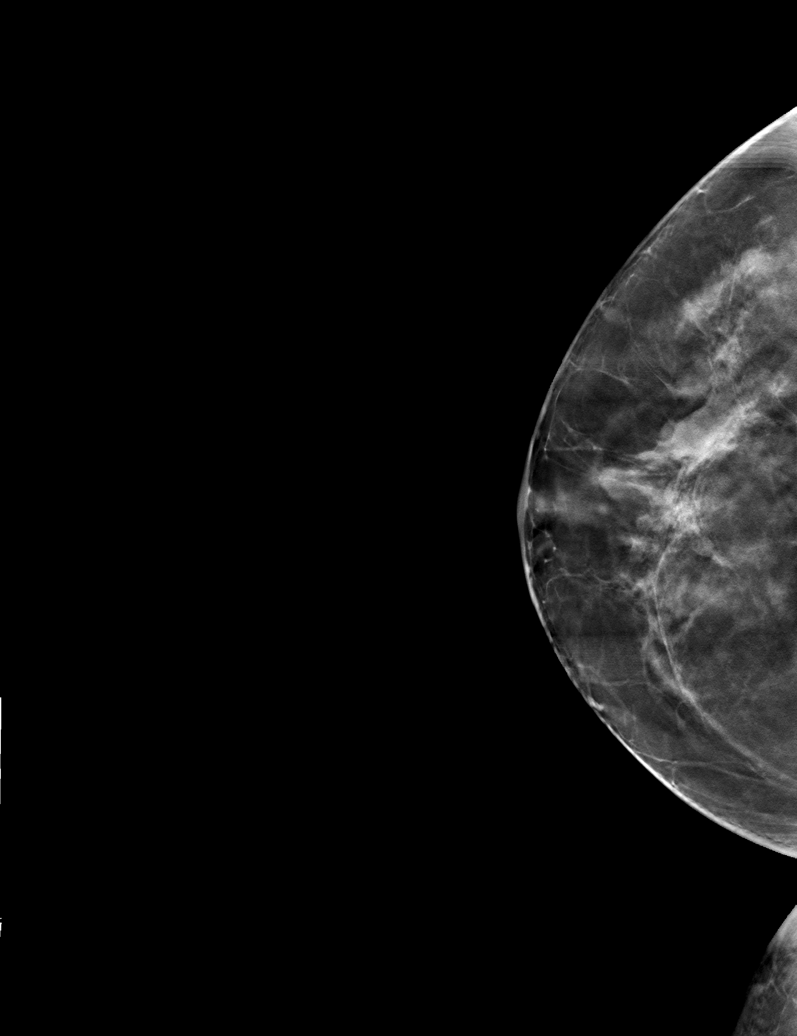

[R CC tomo (2 of 4) · tomo slice 33/64.0]
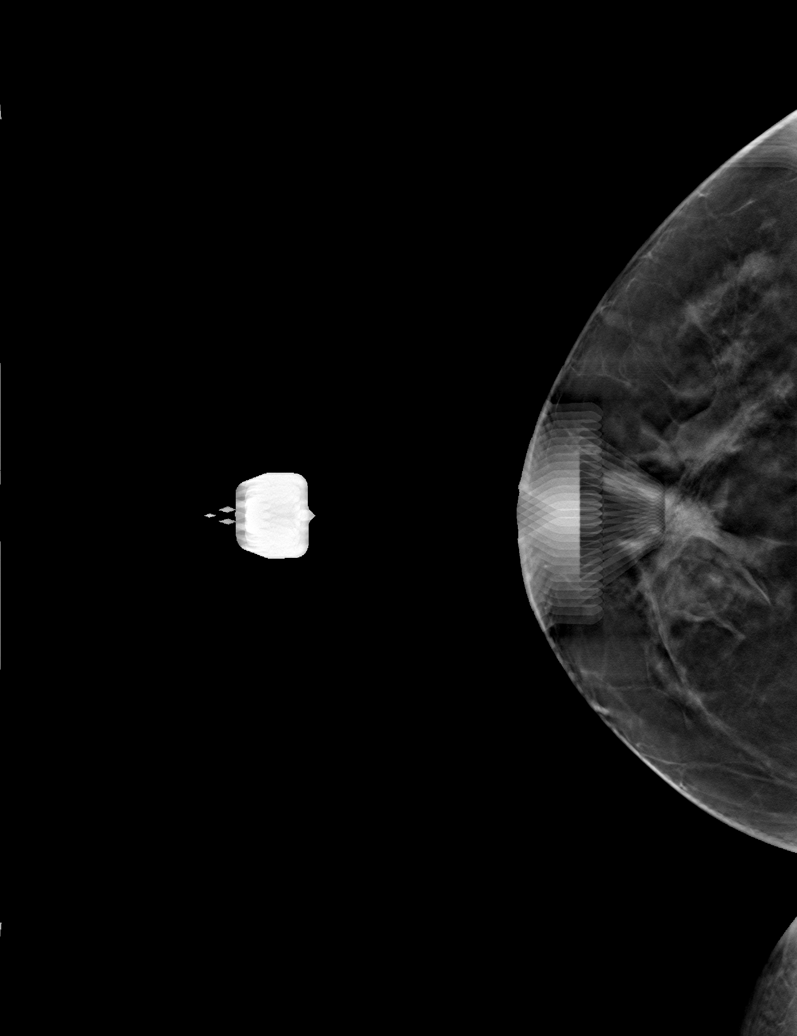

[R CC tomo (3 of 4) · tomo slice 33/64.0]
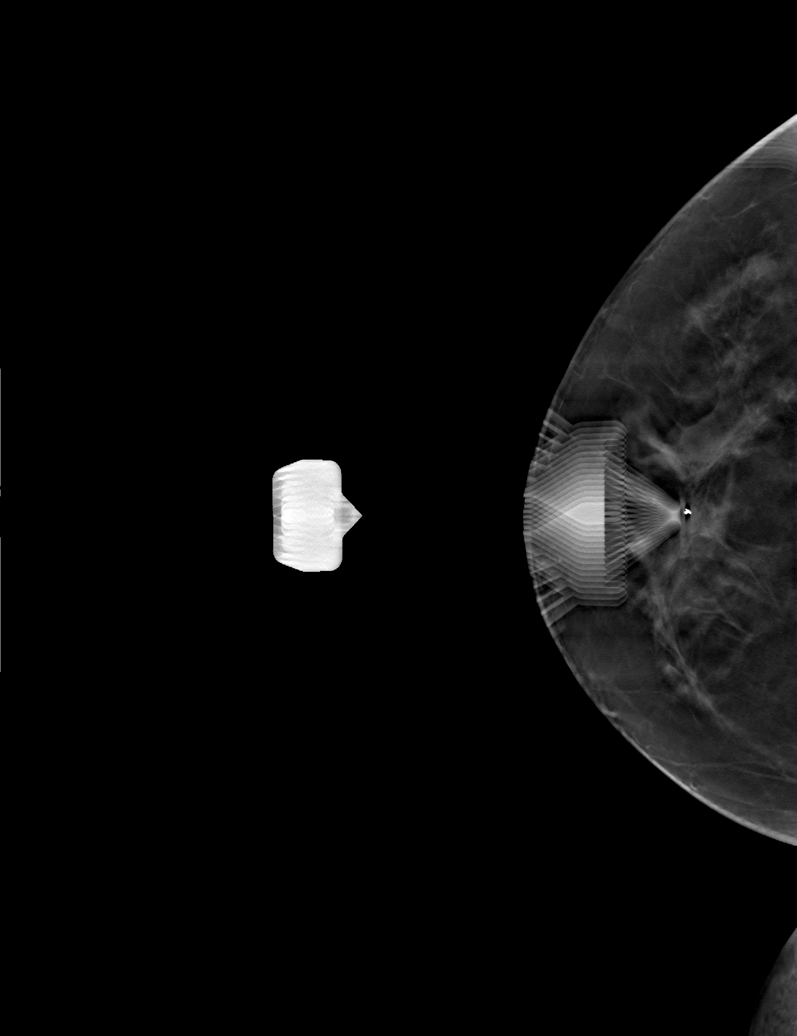

[R CC tomo (4 of 4) · tomo slice 33/64.0]
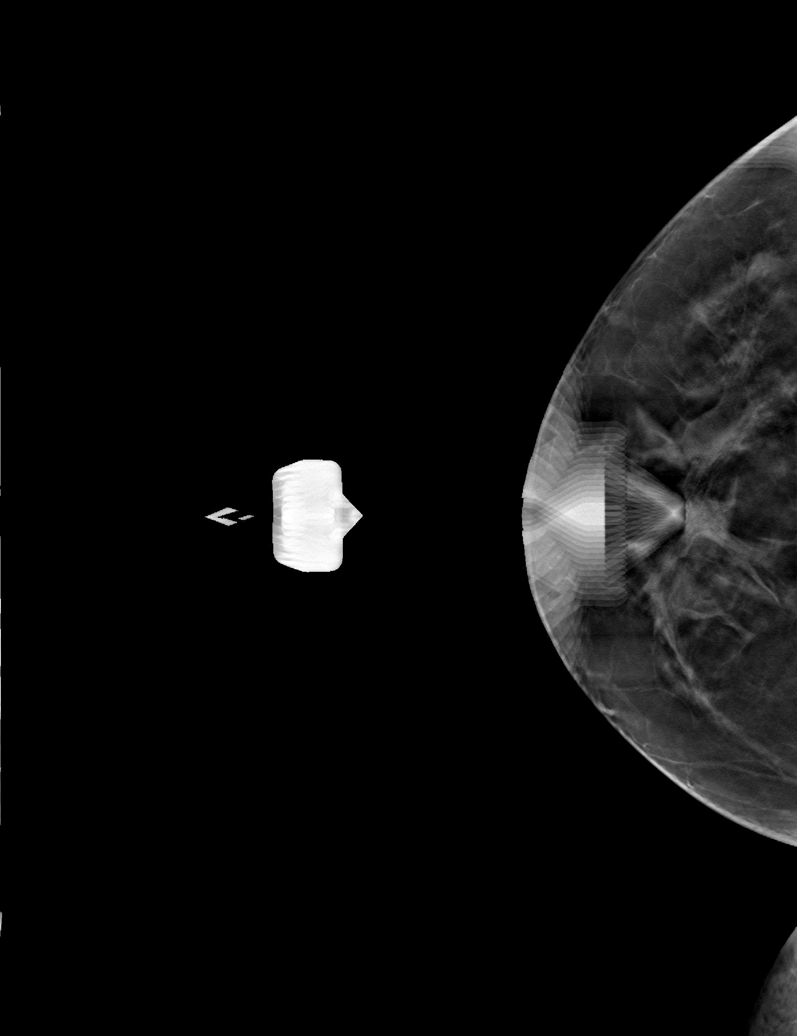

[R]
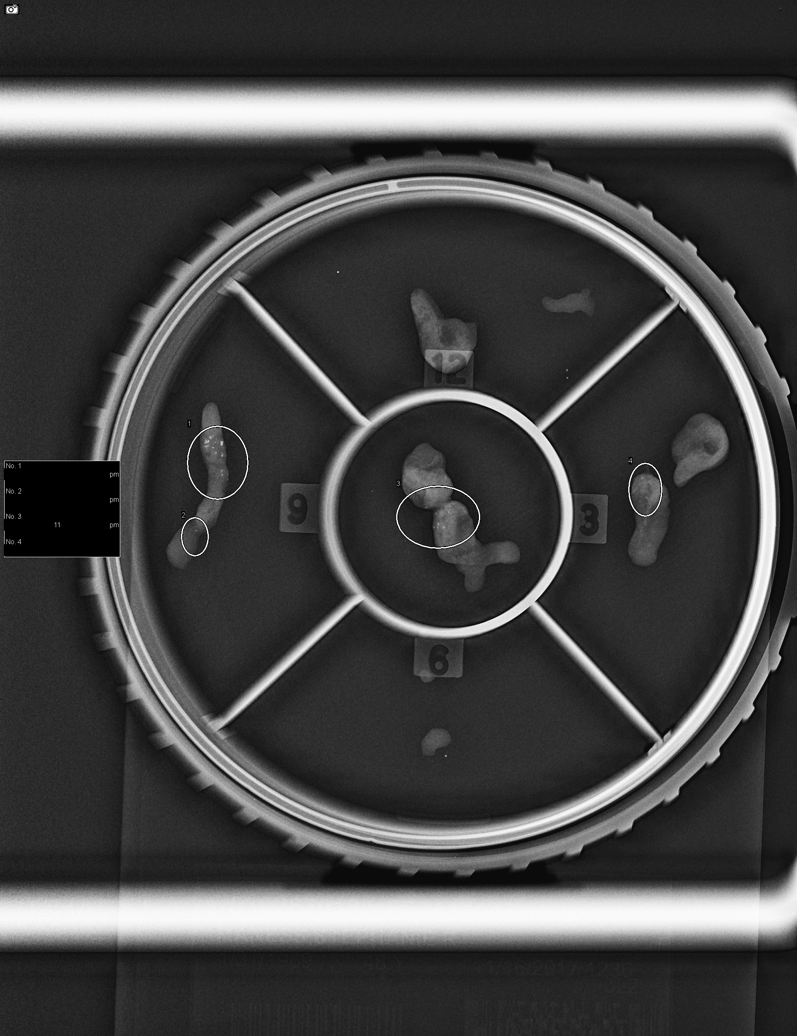

[6 of 21 positions shown; findings below may reference images not displayed]



Using sterile technique and 1% Lidocaine as local anesthetic, under
stereotactic guidance, a 9 gauge vacuum device was used to perform
core needle biopsy of the calcifications in the central upper right
breast using a superior to inferior approach. Specimen radiograph
was performed showing the presence of calcifications. Specimens with
calcifications are identified for pathology.

At the conclusion of the procedure, a top hat shaped tissue marker
clip was deployed into the biopsy cavity. Follow-up 2-view mammogram
was performed and dictated separately.
IMPRESSION: Stereotactic-guided biopsy of calcifications in the central upper
right breast. No apparent complications.

## 2018-02-01 IMAGING — MG MM BREAST SURGICAL SPECIMEN
1 series · 1 of 1 positions shown · non-contrast
Comparison: Previous exam(s).

CLINICAL DATA: Post right breast excision.

EXAM:
SPECIMEN RADIOGRAPH OF THE RIGHT BREAST

[R SPECIMEN]
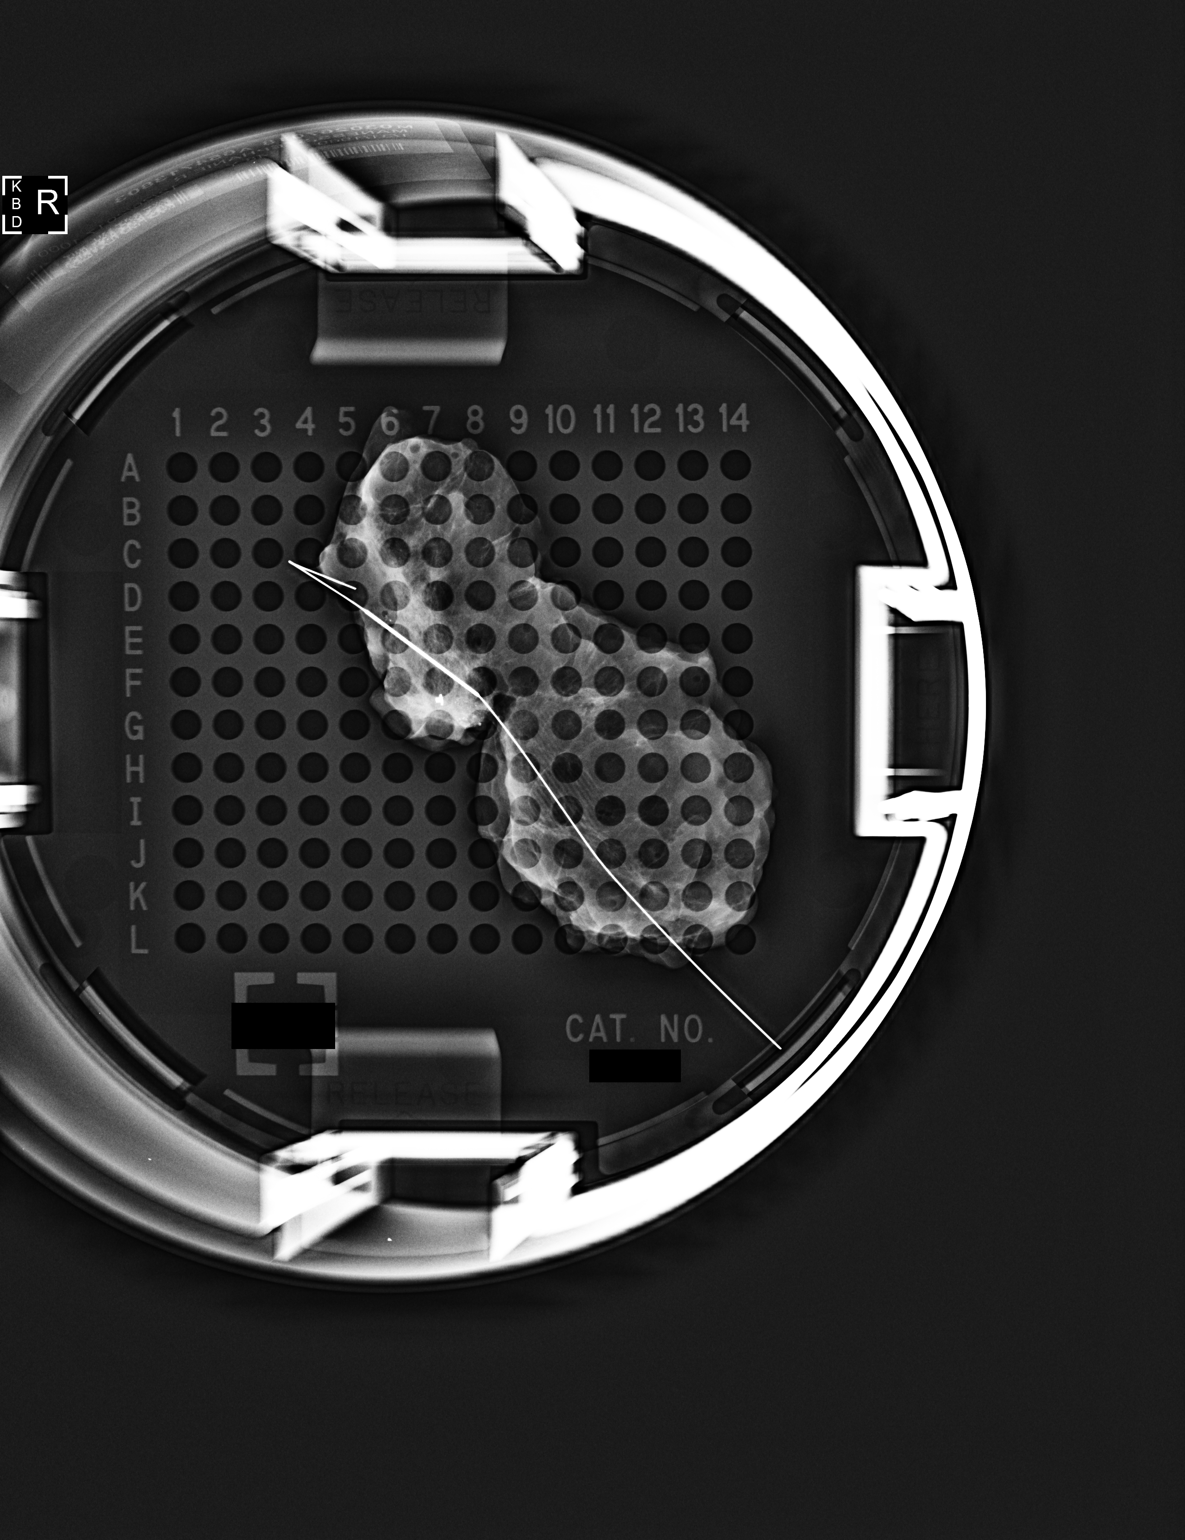

[1 of 1 positions shown; findings below may reference images not displayed]

FINDINGS: Status post excision of the right breast. The wire tip and biopsy
marker clip are present and are marked for pathology with the
calcifications and biopsy marking clip seen at the edge at the
specimen.
IMPRESSION: Specimen radiograph of the right breast.

## 2018-08-14 ENCOUNTER — Other Ambulatory Visit: Payer: Self-pay | Admitting: *Deleted

## 2018-08-14 DIAGNOSIS — Z1231 Encounter for screening mammogram for malignant neoplasm of breast: Secondary | ICD-10-CM

## 2018-10-16 ENCOUNTER — Ambulatory Visit: Payer: Managed Care, Other (non HMO)

## 2018-10-22 ENCOUNTER — Ambulatory Visit: Payer: Managed Care, Other (non HMO) | Admitting: Surgery

## 2018-10-24 ENCOUNTER — Ambulatory Visit: Payer: Managed Care, Other (non HMO) | Admitting: Surgery

## 2018-10-26 ENCOUNTER — Ambulatory Visit
Admission: RE | Admit: 2018-10-26 | Discharge: 2018-10-26 | Disposition: A | Discharge status: Home / Self Care | Payer: Managed Care, Other (non HMO) | Source: Ambulatory Visit | Attending: Surgery | Admitting: Surgery

## 2018-10-26 DIAGNOSIS — Z1231 Encounter for screening mammogram for malignant neoplasm of breast: Secondary | ICD-10-CM | POA: Insufficient documentation

## 2018-10-31 ENCOUNTER — Other Ambulatory Visit: Payer: Self-pay

## 2018-10-31 ENCOUNTER — Ambulatory Visit: Payer: Managed Care, Other (non HMO) | Admitting: Surgery

## 2018-10-31 ENCOUNTER — Encounter: Payer: Self-pay | Admitting: Surgery

## 2018-10-31 VITALS — BP 128/69 | HR 72 | Temp 98.3°F | Ht 61.5 in | Wt 127.0 lb

## 2018-10-31 DIAGNOSIS — N6099 Unspecified benign mammary dysplasia of unspecified breast: Secondary | ICD-10-CM

## 2018-10-31 NOTE — Patient Instructions (Addendum)
Patient will be asked to return to the office in one year with a bilateral screening mammogram.  Continue self breast exams. Call office for any new breast issues or concerns.  

## 2018-10-31 NOTE — Progress Notes (Signed)
Outpatient Surgical Follow Up  10/31/2018  Toni Shea is an 53 y.o. female.   Chief Complaint  Patient presents with  . Follow-up    mammogram     HPI: Toni Shea is a 53 year old female with a history of ADH status post Right  lumpectomy on December 2017 by Dr. Excell Seltzer.  She is following up for a breast exam and to discuss mammogram .  I have personally reviewed the last mammogram showing no evidence of any concerning lesions. She denies any concerns.  No lumps.  No issues with the breast at all. No fevers, no chills, no weight loss.  Grandmother w Hx of breast CA.  Past Medical History:  Diagnosis Date  . Complication of anesthesia   . Headache    MIGRAINES  . Heart murmur   . History of kidney stones   . Mitral valve prolapse     Past Surgical History:  Procedure Laterality Date  . BREAST BIOPSY Right 12/03/2015   atypical ductal hyperplasia   . BREAST EXCISIONAL BIOPSY Right 01/12/2016   ADH removed  . BREAST LUMPECTOMY WITH NEEDLE LOCALIZATION Right 01/12/2016   Procedure: BREAST LUMPECTOMY WITH NEEDLE LOCALIZATION;  Surgeon: Lattie Haw, MD;  Location: [facility] ORS;  Service: General;  Laterality: Right;  . CESAREAN SECTION    . LITHOTRIPSY    . TUBAL LIGATION      Family History  Problem Relation Age of Onset  . Cancer Mother        Melanoma  . Breast cancer Paternal Grandmother     Social History:  reports that she has never smoked. She has never used smokeless tobacco. She reports current alcohol use. She reports that she does not use drugs.  Allergies:  Allergies  Allergen Reactions  . Other     Anesthesia--nausea/vomiting & migraine headache  . Paxil [Paroxetine Hcl] Palpitations    Medications reviewed.  ROS Full ROS performed and is otherwise negative other than what is stated in HPI   BP 128/69   Pulse 72   Temp 98.3 F (36.8 C)   Ht 5' 1.5" (1.562 m)   Wt 127 lb (57.6 kg)   LMP 01/05/2015 (Approximate) Comment: denies preg   SpO2 98%   BMI 23.61 kg/m   Physical Exam Vitals signs and nursing note reviewed. Exam conducted with a chaperone present.  Constitutional:      General: She is not in acute distress.    Appearance: She is normal weight.  Eyes:     General: No scleral icterus.       Right eye: No discharge.        Left eye: No discharge.  Neck:     Musculoskeletal: Normal range of motion and neck supple. No neck rigidity or muscular tenderness.  Cardiovascular:     Rate and Rhythm: Normal rate.     Heart sounds: No murmur.  Pulmonary:     Effort: Pulmonary effort is normal. No respiratory distress.     Breath sounds: Normal breath sounds. No stridor. No wheezing.     Comments: BREAST:  Lumpectomy scar on the Right. No masses, no nipple changes , no LAD. Abdominal:     General: Abdomen is flat. There is no distension.     Tenderness: There is no abdominal tenderness. There is no guarding.  Musculoskeletal: Normal range of motion.  Lymphadenopathy:     Cervical: No cervical adenopathy.  Skin:    General: Skin is warm and dry.  Capillary Refill: Capillary refill takes less than 2 seconds.  Neurological:     General: No focal deficit present.     Mental Status: She is alert and oriented to person, place, and time.  Psychiatric:        Mood and Affect: Mood normal.        Behavior: Behavior normal.        Thought Content: Thought content normal.        Judgment: Judgment normal.      Assessment/Plan: 53 year old female with a history of ADH no evidence of concerning lesions on either mammogram or physical exam.  Continue yearly screenings with physical exams.  I will see her back in 1 year. Greater than 50% of the 25 minutes  visit was spent in counseling/coordination of care   Caroleen Hamman, MD [place] Surgeon

## 2018-12-21 ENCOUNTER — Other Ambulatory Visit: Payer: Self-pay

## 2018-12-21 DIAGNOSIS — Z20822 Contact with and (suspected) exposure to covid-19: Secondary | ICD-10-CM

## 2018-12-23 LAB — NOVEL CORONAVIRUS, NAA: SARS-CoV-2, NAA: NOT DETECTED

## 2019-09-25 ENCOUNTER — Other Ambulatory Visit: Payer: Self-pay

## 2019-09-25 DIAGNOSIS — Z1231 Encounter for screening mammogram for malignant neoplasm of breast: Secondary | ICD-10-CM

## 2019-11-01 ENCOUNTER — Other Ambulatory Visit: Payer: Self-pay

## 2019-11-01 ENCOUNTER — Ambulatory Visit
Admission: RE | Admit: 2019-11-01 | Discharge: 2019-11-01 | Disposition: A | Discharge status: Home / Self Care | Payer: BC Managed Care – PPO | Source: Ambulatory Visit | Attending: Surgery | Admitting: Surgery

## 2019-11-01 DIAGNOSIS — Z1231 Encounter for screening mammogram for malignant neoplasm of breast: Secondary | ICD-10-CM | POA: Diagnosis present

## 2019-11-04 ENCOUNTER — Other Ambulatory Visit: Payer: Self-pay | Admitting: Surgery

## 2019-11-04 DIAGNOSIS — R928 Other abnormal and inconclusive findings on diagnostic imaging of breast: Secondary | ICD-10-CM

## 2019-11-04 DIAGNOSIS — N6320 Unspecified lump in the left breast, unspecified quadrant: Secondary | ICD-10-CM

## 2019-11-08 ENCOUNTER — Telehealth: Payer: Self-pay | Admitting: *Deleted

## 2019-11-08 NOTE — Telephone Encounter (Signed)
Spoke to pt to get AV schd - pt states she has an appt with her MD on Monday, and she wants to speak with him 1st before she schedules an appt.  Pt said she wcb on Monday after her appt.

## 2019-11-11 ENCOUNTER — Other Ambulatory Visit: Payer: Self-pay

## 2019-11-11 ENCOUNTER — Ambulatory Visit (INDEPENDENT_AMBULATORY_CARE_PROVIDER_SITE_OTHER): Payer: BC Managed Care – PPO | Admitting: Surgery

## 2019-11-11 ENCOUNTER — Encounter: Payer: Self-pay | Admitting: Surgery

## 2019-11-11 VITALS — BP 103/66 | HR 71 | Temp 98.1°F | Ht 61.5 in | Wt 121.0 lb

## 2019-11-11 DIAGNOSIS — N6320 Unspecified lump in the left breast, unspecified quadrant: Secondary | ICD-10-CM | POA: Diagnosis not present

## 2019-11-11 NOTE — Progress Notes (Signed)
Outpatient Surgical Follow Up  11/11/2019  Toni Shea is an 54 y.o. female.   Chief Complaint  Patient presents with  . Follow-up    Mammogram    HPI: Toni Shea is a 54 year old female with a history of ADH status post Right  lumpectomy on December 2017 by Dr. Excell Seltzer. She is following up for a breast exam and to discuss mammogram .I have personally reviewed the last mammogram showing evidence of a new asymmetry in the left breast this warrants further imaging and targeted ultrasound. She denies any concerns. No lumps. No issues with the breast at all. No fevers, no chills, no weight loss.  Grandmother w Hx of breast CA  Past Medical History:  Diagnosis Date  . Complication of anesthesia   . Headache    MIGRAINES  . Heart murmur   . History of kidney stones   . Mitral valve prolapse     Past Surgical History:  Procedure Laterality Date  . BREAST BIOPSY Right 12/03/2015   atypical ductal hyperplasia   . BREAST EXCISIONAL BIOPSY Right 01/12/2016   ADH removed  . BREAST LUMPECTOMY WITH NEEDLE LOCALIZATION Right 01/12/2016   Procedure: BREAST LUMPECTOMY WITH NEEDLE LOCALIZATION;  Surgeon: Lattie Haw, MD;  Location: [facility] ORS;  Service: General;  Laterality: Right;  . CESAREAN SECTION    . LITHOTRIPSY    . TUBAL LIGATION      Family History  Problem Relation Age of Onset  . Cancer Mother        Melanoma  . Breast cancer Paternal Grandmother     Social History:  reports that she has never smoked. She has never used smokeless tobacco. She reports current alcohol use. She reports that she does not use drugs.  Allergies:  Allergies  Allergen Reactions  . Other     Anesthesia--nausea/vomiting & migraine headache  . Paxil [Paroxetine Hcl] Palpitations    Medications reviewed.    ROS Full ROS performed and is otherwise negative other than what is stated in HPI   BP 103/66   Pulse 71   Temp 98.1 F (36.7 C)   Ht 5' 1.5" (1.562 m)   Wt 121 lb  (54.9 kg)   LMP 01/05/2015 (Approximate) Comment: denies preg  SpO2 99%   BMI 22.49 kg/m   Physical Exam Vitals and nursing note reviewed. Exam conducted with a chaperone present.  Constitutional:      General: She is not in acute distress.    Appearance: Normal appearance. She is normal weight.  Eyes:     General: No scleral icterus.       Right eye: No discharge.        Left eye: No discharge.  Cardiovascular:     Rate and Rhythm: Normal rate and regular rhythm.     Comments: Breast: Right lumpectomy scar, evidence of any palpable masses on either breast.  No evidence of lymphadenopathy.  No evidence of breast discharge Pulmonary:     Effort: Pulmonary effort is normal. No respiratory distress.     Breath sounds: Normal breath sounds. No stridor. No wheezing or rhonchi.  Abdominal:     General: Abdomen is flat. There is no distension.     Palpations: Abdomen is soft. There is no mass.     Tenderness: There is no abdominal tenderness. There is no guarding or rebound.     Hernia: No hernia is present.  Musculoskeletal:        General: No swelling or tenderness. Normal range  of motion.     Cervical back: Normal range of motion and neck supple. No rigidity or tenderness.  Skin:    General: Skin is warm and dry.     Capillary Refill: Capillary refill takes less than 2 seconds.  Neurological:     General: No focal deficit present.     Mental Status: She is alert and oriented to person, place, and time.  Psychiatric:        Mood and Affect: Mood normal.        Behavior: Behavior normal.        Thought Content: Thought content normal.        Judgment: Judgment normal.         Assessment/Plan: 54 year old female with a prior history of ADH in the right eye now with a new left asymmetry on mammogram.  No evidence of carpal masses on exam.  Definitely recommend ultrasound and if indicated image guided biopsy.  Cussed with patient detail about my thought process.  I will see her  back after the work-up has been completed   Greater than 50% of the 25 minutes  visit was spent in counseling/coordination of care   Sterling Big, MD [facility] General Surgeon

## 2019-11-11 NOTE — Patient Instructions (Addendum)
Go ahead and call to get your ultrasound and added views scheduled.  We will have you follow up here in 3 weeks after these images are completed.

## 2019-12-09 ENCOUNTER — Ambulatory Visit: Payer: BC Managed Care – PPO | Admitting: Surgery

## 2020-01-03 ENCOUNTER — Other Ambulatory Visit: Payer: Self-pay

## 2020-01-03 ENCOUNTER — Ambulatory Visit
Admission: RE | Admit: 2020-01-03 | Discharge: 2020-01-03 | Disposition: A | Discharge status: Home / Self Care | Payer: BC Managed Care – PPO | Source: Ambulatory Visit | Attending: Surgery | Admitting: Surgery

## 2020-01-03 DIAGNOSIS — R928 Other abnormal and inconclusive findings on diagnostic imaging of breast: Secondary | ICD-10-CM

## 2020-01-03 DIAGNOSIS — N6320 Unspecified lump in the left breast, unspecified quadrant: Secondary | ICD-10-CM

## 2020-01-06 ENCOUNTER — Telehealth: Payer: Self-pay

## 2020-01-06 NOTE — Telephone Encounter (Signed)
Spoke with patient and let her know mammogram looks good per Dr.Pabon-keep scheduled appointment 01/27/20 @ 9;30.

## 2020-01-27 ENCOUNTER — Ambulatory Visit: Payer: BC Managed Care – PPO | Admitting: Surgery

## 2020-02-19 ENCOUNTER — Other Ambulatory Visit: Payer: Self-pay

## 2020-02-19 ENCOUNTER — Ambulatory Visit
Admission: EM | Admit: 2020-02-19 | Discharge: 2020-02-19 | Disposition: A | Discharge status: Home / Self Care | Payer: BC Managed Care – PPO | Attending: Sports Medicine | Admitting: Sports Medicine

## 2020-02-19 DIAGNOSIS — R350 Frequency of micturition: Secondary | ICD-10-CM | POA: Insufficient documentation

## 2020-02-19 DIAGNOSIS — R109 Unspecified abdominal pain: Secondary | ICD-10-CM | POA: Insufficient documentation

## 2020-02-19 LAB — URINALYSIS, COMPLETE (UACMP) WITH MICROSCOPIC

## 2020-02-19 NOTE — ED Provider Notes (Addendum)
[facility]    CSN: [id number] Arrival date & time: 02/19/20  0954      History   Chief Complaint Chief Complaint  Patient presents with  . Urinary Frequency    HPI Toni Shea is a 55 y.o. female.   Pleasant 55 year old female who presents for evaluation of the above issues.  Patient reports 2 days of increased frequency and incomplete voiding.  No real pain or burning.  She thought she may have a UTI and she has been flushing her system with water.  Last night her symptoms progressed and she bought some over-the-counter Azo which has turned her urine orange.  This morning she started to have pain in her left side and points over the rib cage mid axillary line.  Also a little into the posterior aspect of her left side of her chest over the flank.  She has had renal stones but that was years ago.  She thinks it is seems somewhat similar.  There is a question of whether or not she has some blood in her urine but with the Azo is difficult to know.  No vaginal discharge or any vaginal issues.  No fever shakes chills.  No nausea vomiting diarrhea.  No red flag signs or symptoms elicited on history.     Past Medical History:  Diagnosis Date  . Complication of anesthesia   . Headache    MIGRAINES  . Heart murmur   . History of kidney stones   . Mitral valve prolapse     Patient Active Problem List   Diagnosis Date Noted  . Atypical ductal hyperplasia of breast 12/16/2015  . 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 Menopause 11/07/2014  . Constipation 11/07/2014  . FH: melanoma 11/07/2014    Past Surgical History:  Procedure Laterality Date  . BREAST BIOPSY Right 12/03/2015   atypical ductal hyperplasia   . BREAST EXCISIONAL BIOPSY Right 01/12/2016   ADH removed  . BREAST LUMPECTOMY WITH NEEDLE LOCALIZATION Right 01/12/2016   Procedure: BREAST LUMPECTOMY WITH NEEDLE LOCALIZATION;  Surgeon: Lattie Haw, MD;  Location: [facility] ORS;  Service: General;   Laterality: Right;  . CESAREAN SECTION    . LITHOTRIPSY    . TUBAL LIGATION      OB History   No obstetric history on file.      Home Medications    Prior to Admission medications   Not on File    Family History Family History  Problem Relation Age of Onset  . Cancer Mother        Melanoma  . Breast cancer Paternal Grandmother     Social History Social History   Tobacco Use  . Smoking status: Never Smoker  . Smokeless tobacco: Never Used  Vaping Use  . Vaping Use: Never used  Substance Use Topics  . Alcohol use: Yes    Alcohol/week: 0.0 standard drinks    Comment: RARE  . Drug use: No     Allergies   Other and Paxil [paroxetine hcl]   Review of Systems Review of Systems  Constitutional: Negative for appetite change, chills, diaphoresis, fatigue and fever.  HENT: Negative.   Eyes: Negative.   Respiratory: Negative.   Cardiovascular: Negative.   Gastrointestinal: Negative for abdominal pain, constipation, diarrhea, nausea and vomiting.  Genitourinary: Positive for decreased urine volume, flank pain, frequency, hematuria and urgency. Negative for dysuria, pelvic pain, vaginal bleeding, vaginal discharge and vaginal pain.  Skin: Negative.   Neurological: Negative.   All other  systems reviewed and are negative.    Physical Exam Triage Vital Signs ED Triage Vitals  Enc Vitals Group     BP 02/19/20 1046 97/74     Pulse Rate 02/19/20 1046 70     Resp 02/19/20 1046 18     Temp 02/19/20 1046 98.6 F (37 C)     Temp Source 02/19/20 1046 Oral     SpO2 02/19/20 1046 97 %     Weight 02/19/20 1044 120 lb (54.4 kg)     Height 02/19/20 1044 5' 1.5" (1.562 m)     Head Circumference --      Peak Flow --      Pain Score 02/19/20 1043 9     Pain Loc --      Pain Edu? --      Excl. in GC? --    No data found.  Updated Vital Signs BP 97/74 (BP Location: Left Arm)   Pulse 70   Temp 98.6 F (37 C) (Oral)   Resp 18   Ht 5' 1.5" (1.562 m)   Wt 54.4 kg    LMP 01/05/2015 (Approximate) Comment: denies preg  SpO2 97%   BMI 22.31 kg/m   Visual Acuity Right Eye Distance:   Left Eye Distance:   Bilateral Distance:    Right Eye Near:   Left Eye Near:    Bilateral Near:     Physical Exam Vitals and nursing note reviewed.  Constitutional:      General: She is not in acute distress.    Appearance: Normal appearance. She is not ill-appearing or toxic-appearing.  HENT:     Head: Normocephalic and atraumatic.  Eyes:     Extraocular Movements: Extraocular movements intact.     Conjunctiva/sclera: Conjunctivae normal.     Pupils: Pupils are equal, round, and reactive to light.  Cardiovascular:     Rate and Rhythm: Normal rate and regular rhythm.     Pulses: Normal pulses.     Heart sounds: Normal heart sounds. No murmur heard. No friction rub. No gallop.   Pulmonary:     Effort: Pulmonary effort is normal. No respiratory distress.     Breath sounds: Normal breath sounds. No stridor. No wheezing, rhonchi or rales.  Abdominal:     General: Bowel sounds are normal. There is no distension.     Palpations: Abdomen is soft. There is no mass.     Tenderness: There is no abdominal tenderness. There is left CVA tenderness. There is no right CVA tenderness, guarding or rebound.  Skin:    General: Skin is warm and dry.     Capillary Refill: Capillary refill takes less than 2 seconds.  Neurological:     General: No focal deficit present.     Mental Status: She is alert and oriented to person, place, and time.      UC Treatments / Results  Labs (all labs ordered are listed, but only abnormal results are displayed) Labs Reviewed  URINALYSIS, COMPLETE (UACMP) WITH MICROSCOPIC - Abnormal; Notable for the following components:      Result Value   Color, Urine RED (*)    Glucose, UA   (*)    Value: TEST NOT REPORTED DUE TO COLOR INTERFERENCE OF URINE PIGMENT   Hgb urine dipstick   (*)    Value: TEST NOT REPORTED DUE TO COLOR INTERFERENCE OF  URINE PIGMENT   Bilirubin Urine   (*)    Value: TEST NOT REPORTED DUE TO COLOR INTERFERENCE  OF URINE PIGMENT   Ketones, ur   (*)    Value: TEST NOT REPORTED DUE TO COLOR INTERFERENCE OF URINE PIGMENT   Protein, ur   (*)    Value: TEST NOT REPORTED DUE TO COLOR INTERFERENCE OF URINE PIGMENT   Nitrite   (*)    Value: TEST NOT REPORTED DUE TO COLOR INTERFERENCE OF URINE PIGMENT   Leukocytes,Ua   (*)    Value: TEST NOT REPORTED DUE TO COLOR INTERFERENCE OF URINE PIGMENT   Bacteria, UA RARE (*)    All other components within normal limits  URINE CULTURE    EKG   Radiology No results found.  Procedures Procedures (including critical care time)  Medications Ordered in UC Medications - No data to display  Initial Impression / Assessment and Plan / UC Course  I have reviewed the triage vital signs and the nursing notes.  Pertinent labs & imaging results that were available during my care of the patient were reviewed by me and considered in my medical decision making (see chart for details).  Clinical impression: Increased urinary frequency with acute onset of mild left-sided flank pain this morning.  Treatment plan: 1.  The findings and treatment plan were discussed in detail with the patient.  Patient was in agreement. 2.  We will obtain a UA.  It did not show any evidence of infection. 3.  I recommended supportive care.  Educational handouts were provided.  I have asked her to flush her system with plenty of water.  Tylenol or Motrin over-the-counter for fever discomfort. 4.  If she does develop worsening symptoms including fever or worsening flank pain she should come back here or seek out care in an emergency room setting. 5.  Work note was provided. 6.  Follow-up here as needed.  Addendum: Initial reading of the UA showed no leukocytes, nitrites, and a few bacteria.  Patient was discharged with supportive care.  After the patient was discharged the lab updated its findings to  say that the UA showed interference of the urine pigment and the result could not be reported.  Urine culture was ordered.  Antibiotics were not prescribed.  Attempts to contact the patient resulted in leaving a message instructing her to call back on the nurses line.  If for urine culture does not fact become positive someone will call in antibiotics.  For now supportive care.    Final Clinical Impressions(s) / UC Diagnoses   Final diagnoses:  Acute left flank pain  Increased urinary frequency     Discharge Instructions     Your urine does not show that you have an infection.  There is also no blood in your urine. I suggest you flush her system with plenty of water and monitor your symptoms.  If they get worse or do not improve, please come back and we can reevaluate.  If they get really bad then please seek out care in an emergency room setting. Please see the educational handouts provided.  I hope you get to feeling better, Dr. Zachery Dauer    ED Prescriptions    None     PDMP not reviewed this encounter.   Delton See, MD 02/19/20 1116    Delton See, MD 02/19/20 [phone]

## 2020-02-19 NOTE — ED Triage Notes (Signed)
Patient states that she has been having urinary urgency, frequency and left flank pain that started 2 days ago. Reports that flank pain started this morning. States that she has had a kidney stone before and felt similar.

## 2020-02-19 NOTE — Discharge Instructions (Signed)
Your urine does not show that you have an infection.  There is also no blood in your urine. I suggest you flush her system with plenty of water and monitor your symptoms.  If they get worse or do not improve, please come back and we can reevaluate.  If they get really bad then please seek out care in an emergency room setting. Please see the educational handouts provided.  I hope you get to feeling better, Dr. Zachery Dauer

## 2020-09-22 ENCOUNTER — Other Ambulatory Visit: Payer: Self-pay

## 2020-09-22 DIAGNOSIS — Z1231 Encounter for screening mammogram for malignant neoplasm of breast: Secondary | ICD-10-CM

## 2020-11-09 ENCOUNTER — Ambulatory Visit: Payer: Self-pay | Admitting: Surgery

## 2021-01-01 ENCOUNTER — Telehealth: Payer: Self-pay

## 2021-01-01 NOTE — Telephone Encounter (Signed)
Call to patient to remind her that she is due for her annual mammogram and follow up. She may call back to reschedule this. If she does not have insurance she may try to be seen through the [facility] program at [phone].  Unable to leave a message as the mailbox is full.
